# Patient Record
Sex: Female | Born: 1995 | Race: White | Hispanic: No | Marital: Single | Smoking: Never smoker
Health system: Southern US, Community
[De-identification: ages and names within clinical notes are randomized; demographics above are authoritative.]

## PROBLEM LIST (undated history)

## (undated) DIAGNOSIS — F909 Attention-deficit hyperactivity disorder, unspecified type: Secondary | ICD-10-CM

## (undated) DIAGNOSIS — Z973 Presence of spectacles and contact lenses: Secondary | ICD-10-CM

## (undated) DIAGNOSIS — F913 Oppositional defiant disorder: Secondary | ICD-10-CM

## (undated) DIAGNOSIS — F32A Depression, unspecified: Secondary | ICD-10-CM

## (undated) DIAGNOSIS — F329 Major depressive disorder, single episode, unspecified: Secondary | ICD-10-CM

## (undated) DIAGNOSIS — F419 Anxiety disorder, unspecified: Secondary | ICD-10-CM

## (undated) HISTORY — DX: Major depressive disorder, single episode, unspecified: F32.9

## (undated) HISTORY — DX: Oppositional defiant disorder: F91.3

## (undated) HISTORY — DX: Depression, unspecified: F32.A

## (undated) HISTORY — DX: Anxiety disorder, unspecified: F41.9

## (undated) HISTORY — PX: WISDOM TOOTH EXTRACTION: SHX21

## (undated) HISTORY — DX: Attention-deficit hyperactivity disorder, unspecified type: F90.9

## (undated) HISTORY — DX: Presence of spectacles and contact lenses: Z97.3

---
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
## 2014-01-31 ENCOUNTER — Encounter (HOSPITAL_COMMUNITY): Payer: Self-pay | Admitting: Psychiatry

## 2014-01-31 ENCOUNTER — Ambulatory Visit (INDEPENDENT_AMBULATORY_CARE_PROVIDER_SITE_OTHER): Payer: 59 | Admitting: Psychiatry

## 2014-01-31 ENCOUNTER — Other Ambulatory Visit (HOSPITAL_COMMUNITY): Payer: Self-pay | Admitting: *Deleted

## 2014-01-31 VITALS — BP 131/82 | HR 83 | Ht 68.0 in | Wt 125.8 lb

## 2014-01-31 DIAGNOSIS — F988 Other specified behavioral and emotional disorders with onset usually occurring in childhood and adolescence: Secondary | ICD-10-CM

## 2014-01-31 DIAGNOSIS — F509 Eating disorder, unspecified: Secondary | ICD-10-CM

## 2014-01-31 DIAGNOSIS — F330 Major depressive disorder, recurrent, mild: Secondary | ICD-10-CM

## 2014-01-31 DIAGNOSIS — F900 Attention-deficit hyperactivity disorder, predominantly inattentive type: Secondary | ICD-10-CM

## 2014-01-31 DIAGNOSIS — F411 Generalized anxiety disorder: Secondary | ICD-10-CM

## 2014-01-31 DIAGNOSIS — F329 Major depressive disorder, single episode, unspecified: Secondary | ICD-10-CM

## 2014-01-31 MED ORDER — BUSPIRONE HCL 5 MG PO TABS: 5.0000 mg | ORAL_TABLET | Freq: Two times a day (BID) | ORAL | Status: DC

## 2014-01-31 MED ORDER — METHYLPHENIDATE 20 MG/9HR TD PTCH: 1.0000 | MEDICATED_PATCH | Freq: Every day | TRANSDERMAL | Status: DC

## 2014-01-31 MED ORDER — BUPROPION HCL ER (XL) 300 MG PO TB24: 300.0000 mg | ORAL_TABLET | Freq: Every day | ORAL | Status: DC

## 2014-01-31 MED ORDER — METHYLPHENIDATE 20 MG/9HR TD PTCH
1.0000 | MEDICATED_PATCH | Freq: Every day | TRANSDERMAL | Status: DC
Start: 2014-01-31 — End: 2014-01-31

## 2014-01-31 NOTE — Telephone Encounter (Signed)
Reprinted prescription for [facility] due to printer error.

## 2014-01-31 NOTE — Progress Notes (Signed)
   [place] Follow-up Outpatient Visit  MALAYLA GRANBERRY 1996-11-09  Date:  01/31/14 Subjective: Patient is here for follow up Patient is always "tired", appetite is fine. Working as a Camera operator, and is applying to school. Concentration is good. Depression is 3/10, Anxiety 5/10, and denies side effects with medications. Eating isn't a problem; she exercises, pilates or yoga. Rtc in 4 weeks.    There were no vitals filed for this visit.  Mental Status Examination  Appearance: casual  Alert: Yes Attention: fair  Cooperative: Yes Eye Contact: Fair Speech: normal  Psychomotor Activity: Normal Memory/Concentration: fair  Oriented: time/date, situation, day of week and month of year Mood: Anxious and Dysphoric Affect: Constricted Thought Processes and Associations: Linear Fund of Knowledge: Fair Thought Content: preoccupations  Insight: Fair Judgement: Fair  Diagnosis:  MDD, recurrent, mild GAD ADD Eating disorder Treatment Plan:  Rtc in 4 weeks Bupropion XL 300 mg po QD, Buspar 5 mg, 2 times daily  Methylphenidate Daytrana 20 mg/9 hr  Madison Hickman, NP

## 2014-02-24 ENCOUNTER — Other Ambulatory Visit (HOSPITAL_COMMUNITY): Payer: Self-pay | Admitting: *Deleted

## 2014-02-24 NOTE — Telephone Encounter (Signed)
error 

## 2014-03-03 ENCOUNTER — Ambulatory Visit (HOSPITAL_COMMUNITY): Payer: Self-pay | Admitting: Psychiatry

## 2014-03-04 ENCOUNTER — Ambulatory Visit (HOSPITAL_COMMUNITY): Payer: Self-pay | Admitting: Psychiatry

## 2014-03-14 ENCOUNTER — Other Ambulatory Visit (HOSPITAL_COMMUNITY): Payer: Self-pay | Admitting: *Deleted

## 2014-03-14 ENCOUNTER — Telehealth (HOSPITAL_COMMUNITY): Payer: Self-pay | Admitting: *Deleted

## 2014-03-14 DIAGNOSIS — F988 Other specified behavioral and emotional disorders with onset usually occurring in childhood and adolescence: Secondary | ICD-10-CM

## 2014-03-14 MED ORDER — METHYLPHENIDATE 20 MG/9HR TD PTCH: 1.0000 | MEDICATED_PATCH | Freq: Every day | TRANSDERMAL | Status: DC

## 2014-03-14 NOTE — Telephone Encounter (Signed)
Opened twice in error

## 2014-04-08 ENCOUNTER — Encounter (HOSPITAL_COMMUNITY): Payer: Self-pay | Admitting: Psychiatry

## 2014-04-08 ENCOUNTER — Ambulatory Visit (INDEPENDENT_AMBULATORY_CARE_PROVIDER_SITE_OTHER): Payer: 59 | Admitting: Psychiatry

## 2014-04-08 VITALS — BP 105/78 | HR 74 | Ht 67.0 in | Wt 120.0 lb

## 2014-04-08 DIAGNOSIS — F988 Other specified behavioral and emotional disorders with onset usually occurring in childhood and adolescence: Secondary | ICD-10-CM

## 2014-04-08 DIAGNOSIS — F411 Generalized anxiety disorder: Secondary | ICD-10-CM

## 2014-04-08 DIAGNOSIS — F900 Attention-deficit hyperactivity disorder, predominantly inattentive type: Secondary | ICD-10-CM

## 2014-04-08 DIAGNOSIS — F330 Major depressive disorder, recurrent, mild: Secondary | ICD-10-CM

## 2014-04-08 DIAGNOSIS — F509 Eating disorder, unspecified: Secondary | ICD-10-CM

## 2014-04-08 DIAGNOSIS — F329 Major depressive disorder, single episode, unspecified: Secondary | ICD-10-CM

## 2014-04-08 MED ORDER — BUPROPION HCL ER (XL) 300 MG PO TB24: 300.0000 mg | ORAL_TABLET | Freq: Every day | ORAL | Status: DC

## 2014-04-08 MED ORDER — BUSPIRONE HCL 5 MG PO TABS: 5.0000 mg | ORAL_TABLET | Freq: Three times a day (TID) | ORAL | Status: DC

## 2014-04-08 MED ORDER — METHYLPHENIDATE 20 MG/9HR TD PTCH: 1.0000 | MEDICATED_PATCH | Freq: Every day | TRANSDERMAL | Status: DC

## 2014-04-08 NOTE — Progress Notes (Signed)
   [name] Follow-up Outpatient Visit  Susan Bauer [birth year]  Date:  04/08/14 Subjective: Pt is here for follow up Sleeping and eating normally. She was upset, her friend tried to hurt herself, and she was upset and not sleeping. The friend won't get help. Sleeping is better. Depression 3/10, Anxiety 8/10. Concentration is good. She is on bupropion XL 300 mg QD daily for depression and anxiety, Daytrana Patch 20 mg/9 hr for adhd, and buspar 5 mg, 2 times daily. She denies SI/HI/AVH   There were no vitals filed for this visit.  Mental Status Examination  Appearance: casual  Alert: Yes Attention: fair  Cooperative: Yes Eye Contact: Fair Speech: WDL Psychomotor Activity: Psychomotor Retardation Memory/Concentration:  Fair  Oriented: time/date, situation and day of week Mood: Anxious and Dysphoric Affect: Constricted Thought Processes and Associations: Goal Directed Fund of Knowledge: Fair Thought Content: preoccupations Insight: Fair Judgement: Fair  Diagnosis:  Eating disorder, unspecified ADD MDD, recurrent, mild GAD Treatment Plan:  Rtc in 4 weeks Buspar 5 mg, tid for anxiety Bupropion XL 300 mg daily for depression Daytrana Patch 20 mg/9  Madison Hickman, NP

## 2014-05-12 ENCOUNTER — Encounter (HOSPITAL_COMMUNITY): Payer: Self-pay | Admitting: Psychiatry

## 2014-05-12 ENCOUNTER — Ambulatory Visit (INDEPENDENT_AMBULATORY_CARE_PROVIDER_SITE_OTHER): Payer: 59 | Admitting: Psychiatry

## 2014-05-12 VITALS — BP 125/69 | HR 86 | Ht 68.5 in | Wt 127.8 lb

## 2014-05-12 DIAGNOSIS — F320 Major depressive disorder, single episode, mild: Secondary | ICD-10-CM

## 2014-05-12 DIAGNOSIS — F411 Generalized anxiety disorder: Secondary | ICD-10-CM

## 2014-05-12 DIAGNOSIS — F900 Attention-deficit hyperactivity disorder, predominantly inattentive type: Secondary | ICD-10-CM

## 2014-05-12 DIAGNOSIS — F988 Other specified behavioral and emotional disorders with onset usually occurring in childhood and adolescence: Secondary | ICD-10-CM

## 2014-05-12 DIAGNOSIS — F509 Eating disorder, unspecified: Secondary | ICD-10-CM

## 2014-05-12 MED ORDER — BUSPIRONE HCL 5 MG PO TABS: 5.0000 mg | ORAL_TABLET | Freq: Three times a day (TID) | ORAL | Status: DC

## 2014-05-12 MED ORDER — METHYLPHENIDATE 20 MG/9HR TD PTCH
1.0000 | MEDICATED_PATCH | Freq: Every day | TRANSDERMAL | Status: DC
Start: 2014-05-12 — End: 2014-05-12

## 2014-05-12 MED ORDER — METHYLPHENIDATE 20 MG/9HR TD PTCH
1.0000 | MEDICATED_PATCH | Freq: Every day | TRANSDERMAL | Status: DC
Start: 2014-05-12 — End: 2014-06-17

## 2014-05-12 MED ORDER — BUPROPION HCL ER (XL) 300 MG PO TB24: 300.0000 mg | ORAL_TABLET | Freq: Every day | ORAL | Status: DC

## 2014-05-12 NOTE — Progress Notes (Signed)
   [place] Follow-up Outpatient Visit  Susan Bauer Jul 24, 1996  Date:  05/12/14 Subjective: Pt is here for follow up Sleeping is poor; appetite is fair to poor. Mood is "stressed out." Depression 5/10, Anxiety 7/10. Pt is having back programs, and is not working at present time. Pt is trying to see therapist, next appointment is in September. Pt has been putting ice compresses on back; suggested warm compresses for pain,and swimming to exercise back. Mood is dysphoric,anxious. Tolerating her medications. Concentration is fair. She denies SI/HI/AVH. Rtc in 4 weeks.    There were no vitals filed for this visit.  Mental Status Examination  Appearance: casual  Alert: Yes Attention: fair  Cooperative: Yes Eye Contact: Fair Speech: slow  Psychomotor Activity: Psychomotor Retardation Memory/Concentration: fair  Oriented: time/date, situation and day of week Mood: Anxious and Dysphoric Affect: Constricted and Depressed Thought Processes and Associations: Linear Fund of Knowledge: Fair Thought Content: preoccupations Insight: Fair Judgement: Fair  Diagnosis:  ADD, without hyperactivity Eating disorder, unspecified MDD, single episode, mild  GAD Treatment Plan:  Rtc in 4 weeks Daytrana Patch 20 mg/dl for adhd Bupropion XL 300 mg po depression Buspar 5 mg tid for anxiety  Madison Hickman, NP

## 2014-05-16 ENCOUNTER — Ambulatory Visit (HOSPITAL_COMMUNITY): Payer: Self-pay | Admitting: Psychiatry

## 2014-05-27 ENCOUNTER — Ambulatory Visit (HOSPITAL_COMMUNITY): Payer: Self-pay | Admitting: Psychiatry

## 2014-06-17 ENCOUNTER — Encounter (HOSPITAL_COMMUNITY): Payer: Self-pay | Admitting: Psychiatry

## 2014-06-17 ENCOUNTER — Ambulatory Visit (INDEPENDENT_AMBULATORY_CARE_PROVIDER_SITE_OTHER): Payer: 59 | Admitting: Psychiatry

## 2014-06-17 VITALS — BP 107/73 | HR 88 | Ht 69.0 in | Wt 121.4 lb

## 2014-06-17 DIAGNOSIS — F331 Major depressive disorder, recurrent, moderate: Secondary | ICD-10-CM

## 2014-06-17 DIAGNOSIS — F900 Attention-deficit hyperactivity disorder, predominantly inattentive type: Secondary | ICD-10-CM

## 2014-06-17 DIAGNOSIS — F988 Other specified behavioral and emotional disorders with onset usually occurring in childhood and adolescence: Secondary | ICD-10-CM

## 2014-06-17 DIAGNOSIS — F411 Generalized anxiety disorder: Secondary | ICD-10-CM

## 2014-06-17 MED ORDER — ARIPIPRAZOLE 2 MG PO TABS: 2.0000 mg | ORAL_TABLET | Freq: Every day | ORAL | Status: DC

## 2014-06-17 MED ORDER — BUPROPION HCL ER (XL) 450 MG PO TB24: 450.0000 mg | ORAL_TABLET | Freq: Every day | ORAL | Status: DC

## 2014-06-17 MED ORDER — METHYLPHENIDATE 20 MG/9HR TD PTCH: 1.0000 | MEDICATED_PATCH | Freq: Every day | TRANSDERMAL | Status: DC

## 2014-06-17 NOTE — Progress Notes (Addendum)
   [name] Follow-up Outpatient Visit  Susan Bauer [birth year]  Date:  06/17/14 Subjective:  Depression 6/10, Anxiety 6/10. Sleeping 8 hours, during the day. Appetite is fine. She denies SI/HI/AVH. She recently went to a concert. Mom reports she has anhedonia, low energy, poor concentration. Mom reports she is isolative, withdrawn, poor ADL's. Pt has a PHQ9 is 13, which is moderate depression. Pt has low energy; suggested going to PCP to rule out medical issues, ie Anemia. Pt is taking ferrous sulfate 40 mg po, but it may not be enough. Pt sees therapist, Eloise Levels in September.Will increase bupropion XL 450 mg po daily for depression, aripiprazole 2 mg po for adjuvant therapy, buspar 5 mg tid for anxiety, Daytrana 20 mg Patch. Encouraged to keep a regular sleep-wake cycle. Rtc in 4 weeks.   There were no vitals filed for this visit.  Mental Status Examination  Appearance: casual  Alert: Yes Attention: fair  Cooperative: Yes Eye Contact: Fair Speech: slow  Psychomotor Activity: Psychomotor Retardation Memory/Concentration: fair  Oriented: time/date and day of week Mood: Anxious and Depressed Affect: Constricted and Depressed Thought Processes and Associations: Linear Fund of Knowledge: Fair Thought Content: preoccupations Insight: Fair Judgement: Fair  Diagnosis:  MDD, recurrent, moderate  GAD ADD  Treatment Plan:  Rtc in 4 weeks Bupropion XL 450 mg po for depression buspar 5 mg tid anxiety  Daytrana Patch 20 mg/9hours cor concentration  Madison Hickman, NP

## 2014-07-20 ENCOUNTER — Ambulatory Visit (HOSPITAL_COMMUNITY): Payer: Self-pay | Admitting: Psychiatry

## 2014-07-26 ENCOUNTER — Ambulatory Visit (HOSPITAL_COMMUNITY): Payer: Self-pay | Admitting: Psychiatry

## 2014-07-29 ENCOUNTER — Ambulatory Visit (INDEPENDENT_AMBULATORY_CARE_PROVIDER_SITE_OTHER): Payer: 59 | Admitting: Psychiatry

## 2014-07-29 DIAGNOSIS — F909 Attention-deficit hyperactivity disorder, unspecified type: Secondary | ICD-10-CM

## 2014-07-29 DIAGNOSIS — F900 Attention-deficit hyperactivity disorder, predominantly inattentive type: Secondary | ICD-10-CM

## 2014-07-29 DIAGNOSIS — F411 Generalized anxiety disorder: Secondary | ICD-10-CM

## 2014-07-29 NOTE — Progress Notes (Signed)
   THERAPIST PROGRESS NOTE  Session Time: 3:00-4:00   Participation Level: Active   Behavioral Response: CasualAlertAnxiousDysphoric   Type of Therapy: Individual Therapy   Treatment Goals addressed: managing anxiety, managing depression, body acceptance   Interventions: CBT, strength-based, supportive  Summary: Susan Bauer is a 18 y.o. female who presents with depression, anxiety.   Suicidal/Homicidal: Nowithout intent/plan   Therapist Response: Pt. Was joined in session by her mother Barbaraann Share). This is patient's first session since July 27 2013. Pt. 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 Pt. Was preoccupied with thoughts that her co-workers did not like her. Pt. Was forced to discontinue the job because of back pain that was aggravated by lifting the animals and sweeping and mopping. Pt. Since learned that she has mild scoliosis. Pt. Is now considering a job working in a retirement facility. Pt. Continues to be focused on co-dependent relationship with her best friend and belief that she is responsible for the quality of her friends life who consistently makes poor choices. Pt. Is focused on body pain and mysterious somatic complaints and favorite shows are House and Criminal minds. Pt. Was encouraged to explore [email or web address]://www.[email or web address].[email or web address] in order to find social groups with common interests. Pt. Would like to work on social anxiety, fears about driving, difficulty developing motivation for life tasks such as applying for jobs and continuing her education. Brief education about DBT groups was given.   Plan: Return again in 3 weeks.   Diagnosis: Axis I: Anxiety Disorder NOS and Depressive Disorder NOS   Axis II: No diagnosis     Renford Dills 07/29/2014

## 2014-08-19 ENCOUNTER — Ambulatory Visit (HOSPITAL_COMMUNITY): Payer: Self-pay | Admitting: Psychiatry

## 2014-09-09 ENCOUNTER — Ambulatory Visit (HOSPITAL_COMMUNITY): Payer: Self-pay | Admitting: Psychiatry

## 2014-09-22 ENCOUNTER — Encounter (HOSPITAL_COMMUNITY): Payer: Self-pay | Admitting: Psychiatry

## 2014-09-22 ENCOUNTER — Ambulatory Visit (INDEPENDENT_AMBULATORY_CARE_PROVIDER_SITE_OTHER): Payer: 59 | Admitting: Psychiatry

## 2014-09-22 VITALS — BP 130/59 | HR 65 | Ht 68.25 in | Wt 130.8 lb

## 2014-09-22 DIAGNOSIS — F4010 Social phobia, unspecified: Secondary | ICD-10-CM | POA: Insufficient documentation

## 2014-09-22 DIAGNOSIS — F418 Other specified anxiety disorders: Secondary | ICD-10-CM

## 2014-09-22 DIAGNOSIS — F411 Generalized anxiety disorder: Secondary | ICD-10-CM

## 2014-09-22 DIAGNOSIS — F988 Other specified behavioral and emotional disorders with onset usually occurring in childhood and adolescence: Secondary | ICD-10-CM

## 2014-09-22 DIAGNOSIS — F900 Attention-deficit hyperactivity disorder, predominantly inattentive type: Secondary | ICD-10-CM

## 2014-09-22 DIAGNOSIS — F332 Major depressive disorder, recurrent severe without psychotic features: Secondary | ICD-10-CM

## 2014-09-22 MED ORDER — FLUOXETINE HCL 40 MG PO CAPS: 40.0000 mg | ORAL_CAPSULE | Freq: Every day | ORAL | Status: DC

## 2014-09-22 MED ORDER — ARIPIPRAZOLE 2 MG PO TABS: 2.0000 mg | ORAL_TABLET | Freq: Every day | ORAL | Status: DC

## 2014-09-22 MED ORDER — METHYLPHENIDATE 20 MG/9HR TD PTCH: 1.0000 | MEDICATED_PATCH | Freq: Every day | TRANSDERMAL | Status: DC

## 2014-09-22 NOTE — Progress Notes (Signed)
[facility] [phone] Progress Note  Susan Bauer [id number] 18 y.o.  09/22/2014 3:49 PM  Chief Complaint: f/up  History of Present Illness: Here with mother.  Pt is now working with a new therapist Dr. Ernestene Mention.   Pt thinks she has Bipolar disorder instead of depression and anxiety. Pt states she has periods of intense anger and depression. She gets angry ever couple of weeks that last for 3-7 days. During that time her sleep is more restless. Energy is minorly increased. Motivation is a little better and she is able to finish one or 2 things. Her stress tolerance is low and she denies mood lability. As she has grown older pt is a little better able to control her behavior. Denies grandiose thoughts. During this time she will spend a little more (up to $30 per day, not more than in her bank account). Symptoms will suddenly go away.  Other time she is upset, tired and depressed. She is depressed on a daily basis and level is 7/10. Pt reports isolation and will not leave her room for days on end. Reports feeling withdrawn, low motivation, anhedonia, crying spells and worthlessness. Sleep is poor in general. She has poor body image and she is snacking all day long.   Anxiety is high and she has poor self esteem. Feels like no one likes her and she is boring. Last panic attack was 3 months ago.  Concentration is fair with Daytrona patch. She reports a decreased appetite and some mild skin irritation.   Pt is not taking Buspar b/c it causes dizziness.  Pt is taking Abilify and Wellbutrin as prescribed and denies SE.  Suicidal Ideation: No Plan Formed: No Patient has means to carry out plan: No  Homicidal Ideation: No Plan Formed: No Patient has means to carry out plan: No  Review of Systems: Psychiatric: Agitation: Yes Hallucination: No Depressed Mood: Yes Insomnia: Yes Hypersomnia: No Altered Concentration: No Feels Worthless: Yes Grandiose Ideas: No Belief In  Special Powers: No New/Increased Substance Abuse: No Compulsions: No  Neurologic: Headache: Yes Seizure: No Paresthesias: No  Review of Systems  Constitutional: Negative for fever and chills.  HENT: Negative for congestion, ear discharge, nosebleeds and sore throat.   Eyes: Negative for blurred vision, double vision and redness.  Respiratory: Negative for cough, sputum production and shortness of breath.   Cardiovascular: Negative for chest pain, palpitations and leg swelling.  Gastrointestinal: Positive for abdominal pain. Negative for heartburn, nausea and vomiting.  Musculoskeletal: Negative for back pain, joint pain and neck pain.  Skin: Negative for itching and rash.  Neurological: Positive for dizziness and headaches. Negative for tremors, seizures and weakness.  Psychiatric/Behavioral: Positive for depression. Negative for suicidal ideas, hallucinations and substance abuse. The patient is nervous/anxious and has insomnia.      Past Medical Family, Social History: lives with mom.  reports that she has never smoked. She has never used smokeless tobacco. She reports that she does not drink alcohol or use illicit drugs.  Family History  Problem Relation Age of Onset  . Depression Mother    Past Medical History  Diagnosis Date  . Wears glasses   . ADHD (attention deficit hyperactivity disorder)   . Oppositional defiant disorder   . Anxiety   . Depression     Outpatient Encounter Prescriptions as of 09/22/2014  Medication Sig  . ARIPiprazole (ABILIFY) 2 MG tablet Take 1 tablet (2 mg total) by mouth daily.  Marland Kitchen buPROPion 450 MG TB24 Take  450 mg by mouth daily.  . Calcium Carbonate-Vitamin D (CALCIUM 600+D HIGH POTENCY) 600-400 MG-UNIT per tablet Take 1 tablet by mouth 2 (two) times daily.  . Clindamycin Phos-Benzoyl Perox (ONEXTON) 1.2-3.75 % GEL Apply topically.  . ferrous sulfate 325 (65 FE) MG tablet Take 325 mg by mouth daily with breakfast.  . methylphenidate  (DAYTRANA) 20 MG/9HR Place 1 patch onto the skin daily. wear patch for 9 hours only each day  . MINOCYCLINE HCL PO Take by mouth.  . Multiple Vitamin (MULITIVITAMIN WITH MINERALS) TABS Take 1 tablet by mouth daily.  . norethindrone-ethinyl estradiol (JUNEL FE,GILDESS FE,LOESTRIN FE) 1-20 MG-MCG tablet Take 1 tablet by mouth daily.  . busPIRone (BUSPAR) 5 MG tablet Take 1 tablet (5 mg total) by mouth 3 (three) times daily.    Past Psychiatric History/Hospitalization(s): Anxiety: Yes Bipolar Disorder: No Depression: Yes Mania: No Psychosis: No Schizophrenia: No Personality Disorder: No Hospitalization for psychiatric illness: No History of Electroconvulsive Shock Therapy: No Prior Suicide Attempts: Yes  Physical Exam: Constitutional:  BP 130/59 mmHg  Pulse 65  Ht 5' 8.25" (1.734 m)  Wt 130 lb 12.8 oz (59.33 kg)  BMI 19.73 kg/m2  General Appearance: alert, oriented, no acute distress and well nourished  Musculoskeletal: Strength & Muscle Tone: within normal limits Gait & Station: normal Patient leans: N/A  Mental Status Examination/Evaluation: Objective: Attitude: Calm and cooperative  Appearance: Fairly Groomed, appears to be stated age  Eye Contact::  Minimal  Speech:  Clear and Coherent and Normal Rate  Volume:  Normal  Mood:  depressed  Affect:  Congruent  Thought Process:  Goal Directed, Linear and Logical  Orientation:  Full (Time, Place, and Person)  Thought Content:  Negative  Suicidal Thoughts:  No  Homicidal Thoughts:  No  Judgement:  Fair  Insight:  Fair  Concentration: good  Memory: Immediate-intact Recent-intact Remote-intact  Recall: fair  Language: fair  Gait and Station: normal  [employer] of Knowledge: average  Psychomotor Activity:  Normal  Akathisia:  No  Handed:  Right  AIMS (if indicated):  Facial and Oral Movements  Muscles of Facial Expression: None, normal  Lips and Perioral Area: None, normal  Jaw: None, normal  Tongue: None,  normal Extremity Movements: Upper (arms, wrists, hands, fingers): None, normal  Lower (legs, knees, ankles, toes): None, normal,  Trunk Movements:  Neck, shoulders, hips: None, normal,  Overall Severity : Severity of abnormal movements (highest score from questions above): None, normal  Incapacitation due to abnormal movements: None, normal  Patient's awareness of abnormal movements (rate only patient's report): No Awareness, Dental Status  Current problems with teeth and/or dentures?: No  Does patient usually wear dentures?: No    Assets:  Communication Skills Desire for Improvement Financial Resources/Insurance Housing Leisure Time Physical Health Resilience Social Support Talents/Skills       Medical Decision Making (Choose Three): Review of Psycho-Social Stressors (1), Review or order clinical lab tests (1), Review and summation of old records (2), Established Problem, Worsening (2), Review of Medication Regimen & Side Effects (2) and Review of New Medication or Change in Dosage (2)  Assessment: Axis I: MDD- recurrent, severe without psychotic features, GAD, Social anxiety disorder, ADHD- inattentive type  Axis II: deferred  Axis III:  Past Medical History  Diagnosis Date  . Wears glasses   . ADHD (attention deficit hyperactivity disorder)   . Oppositional defiant disorder   . Anxiety   . Depression      Axis IV: home stressors, limited  coping skills  Axis V: GAF 51-60   Plan:   Abilify 2mg  po qD for adjunct treatment of mood D/c Bupropion as pt reports it is ineffective and she was only taking 150mg  daily Restart Prozac 40mg  po for depression (Reviewed pt's records and it appears she benefited from Prozac and was on  40mg  up until Oct 2014) D/c buspar as pt reports dizziness as a SE  Daytrana Patch 20 mg/9hours for concentration  Medication management with supportive therapy. Risks/benefits and SE of the medication discussed. Pt verbalized understanding  and verbal consent obtained for treatment.  Affirm with the patient that the medications are taken as ordered. Patient expressed understanding of how their medications were to be used.  -worsening of symptoms  Labs- pt will sign MR to get recent labs from PCP  Therapy: brief supportive therapy provided. Discussed psychosocial stressors in detail.   Encouraged to continue individual therapy with Dr. Ernestene Mention  Pt denies SI and is at an acute low risk for suicide.Patient told to call clinic if any problems occur. Patient advised to go to ER if they should develop SI/HI, side effects, or if symptoms worsen. Has crisis numbers to call if needed. Pt verbalized understanding.  F/up in 2 months or sooner if needed   Charlcie Cradle, MD 09/22/2014

## 2014-09-30 ENCOUNTER — Ambulatory Visit (HOSPITAL_COMMUNITY): Payer: Self-pay | Admitting: Psychiatry

## 2014-11-22 ENCOUNTER — Telehealth (HOSPITAL_COMMUNITY): Payer: Self-pay | Admitting: Psychiatry

## 2014-11-22 ENCOUNTER — Encounter (HOSPITAL_COMMUNITY): Payer: Self-pay | Admitting: Psychiatry

## 2014-11-22 ENCOUNTER — Ambulatory Visit (INDEPENDENT_AMBULATORY_CARE_PROVIDER_SITE_OTHER): Payer: 59 | Admitting: Psychiatry

## 2014-11-22 VITALS — BP 123/64 | HR 76 | Ht 68.5 in | Wt 128.6 lb

## 2014-11-22 DIAGNOSIS — F988 Other specified behavioral and emotional disorders with onset usually occurring in childhood and adolescence: Secondary | ICD-10-CM

## 2014-11-22 DIAGNOSIS — F418 Other specified anxiety disorders: Secondary | ICD-10-CM

## 2014-11-22 DIAGNOSIS — F332 Major depressive disorder, recurrent severe without psychotic features: Secondary | ICD-10-CM

## 2014-11-22 DIAGNOSIS — F900 Attention-deficit hyperactivity disorder, predominantly inattentive type: Secondary | ICD-10-CM

## 2014-11-22 DIAGNOSIS — F411 Generalized anxiety disorder: Secondary | ICD-10-CM

## 2014-11-22 DIAGNOSIS — F4010 Social phobia, unspecified: Secondary | ICD-10-CM

## 2014-11-22 MED ORDER — ARIPIPRAZOLE 2 MG PO TABS: 2.0000 mg | ORAL_TABLET | Freq: Every day | ORAL | Status: DC

## 2014-11-22 MED ORDER — FLUOXETINE HCL 20 MG PO CAPS: 60.0000 mg | ORAL_CAPSULE | Freq: Every day | ORAL | Status: DC

## 2014-11-22 MED ORDER — LORAZEPAM 0.5 MG PO TABS: 0.5000 mg | ORAL_TABLET | Freq: Every day | ORAL | Status: AC | PRN

## 2014-11-22 MED ORDER — METHYLPHENIDATE 20 MG/9HR TD PTCH: 1.0000 | MEDICATED_PATCH | Freq: Every day | TRANSDERMAL | Status: DC

## 2014-11-22 NOTE — Telephone Encounter (Signed)
Received a message from pt's mother asking that provider contact pt's psychologist Dr. Ernestene Mention at [phone].  I have called and left a message for Dr. Rodman Pickle asking for a call back at her earliest convenience.

## 2014-11-22 NOTE — Progress Notes (Signed)
[facility] [phone] Progress Note  Susan Bauer [id number] 19 y.o.  11/22/2014 4:15 PM  Chief Complaint: better  History of Present Illness: Here with mother. Mom states Prozac has made a big difference. Seems happier and is more social. She still has a lot of anxiety.  Pt is working with a new therapist Dr. Ernestene Mention.   Pt states anxiety is mostly social. She can't talk to strangers well. It is holding her back from college. Pt is avoiding college b/c of it. Pt would like to move forward and accomplish some things. Prozac has made no difference with social anxiety. Pt gets very anxious before work and sometimes can't sleep. 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 Denies any recent panic attacks. Worried about being judged and doing something wrong.   Depression has improved significantly. Energy is good. Not comparing herself to friends and is more social with friends. She is trying to make new friends online and friends are introducing her to new people. Denies anhedonia and crying spells and sad mood. No longer feels lifeless and numb. Appetite is ok and she eating 3 meals/day. Most nights sleeping about 6-7 hrs.  Body image is a little better but still bad. She is working on that in therapy.   Concentration is fair with Daytrona patch. She reports a decreased appetite and some mild skin irritation.   Pt is taking Abilify as prescribed and denies SE.  Suicidal Ideation: No Plan Formed: No Patient has means to carry out plan: No  Homicidal Ideation: No Plan Formed: No Patient has means to carry out plan: No  Review of Systems: Psychiatric: Agitation: No Hallucination: No Depressed Mood: No Insomnia: No Hypersomnia: No Altered Concentration: No Feels Worthless: Yes Grandiose Ideas: No Belief In Special Powers: No New/Increased Substance Abuse: No Compulsions: No  Neurologic: Headache: No Seizure: No Paresthesias:  No  Review of Systems  Constitutional: Negative for fever and chills.  HENT: Negative for congestion, ear discharge, nosebleeds and sore throat.   Eyes: Negative for blurred vision, double vision and redness.  Respiratory: Negative for cough, sputum production and shortness of breath.   Cardiovascular: Negative for chest pain, palpitations and leg swelling.  Gastrointestinal: Negative for heartburn, nausea, vomiting and abdominal pain.  Musculoskeletal: Negative for back pain, joint pain and neck pain.  Skin: Negative for itching and rash.  Neurological: Negative for dizziness, tremors, seizures, weakness and headaches.  Psychiatric/Behavioral: Negative for depression, suicidal ideas, hallucinations and substance abuse. The patient is nervous/anxious. The patient does not have insomnia.      Past Medical Family, Social History: lives with mom. Works at a [employer], [employer].   reports that she has never smoked. She has never used smokeless tobacco. She reports that she does not drink alcohol or use illicit drugs.  Family History  Problem Relation Age of Onset  . Depression Mother    Past Medical History  Diagnosis Date  . Wears glasses   . ADHD (attention deficit hyperactivity disorder)   . Oppositional defiant disorder   . Anxiety   . Depression     Outpatient Encounter Prescriptions as of 11/22/2014  Medication Sig  . ARIPiprazole (ABILIFY) 2 MG tablet Take 1 tablet (2 mg total) by mouth daily.  . Calcium Carbonate-Vitamin D (CALCIUM 600+D HIGH POTENCY) 600-400 MG-UNIT per tablet Take 1 tablet by mouth 2 (two) times daily.  . Clindamycin Phos-Benzoyl Perox (ONEXTON) 1.2-3.75 % GEL Apply topically.  . ferrous sulfate 325 (65  FE) MG tablet Take 325 mg by mouth daily with breakfast.  . FLUoxetine (PROZAC) 40 MG capsule Take 1 capsule (40 mg total) by mouth daily.  . methylphenidate (DAYTRANA) 20 MG/9HR Place 1 patch onto the skin daily. wear patch for 9 hours only each day  .  methylphenidate (DAYTRANA) 20 MG/9HR Place 1 patch onto the skin daily. wear patch for 9 hours only each day  . MINOCYCLINE HCL PO Take by mouth.  . Multiple Vitamin (MULITIVITAMIN WITH MINERALS) TABS Take 1 tablet by mouth daily.  . norethindrone-ethinyl estradiol (JUNEL FE,GILDESS FE,LOESTRIN FE) 1-20 MG-MCG tablet Take 1 tablet by mouth daily.    Past Psychiatric History/Hospitalization(s): Anxiety: Yes Bipolar Disorder: No Depression: Yes Mania: No Psychosis: No Schizophrenia: No Personality Disorder: No Hospitalization for psychiatric illness: No History of Electroconvulsive Shock Therapy: No Prior Suicide Attempts: Yes  Physical Exam: Constitutional:  BP 123/64 mmHg  Pulse 76  Ht 5' 8.5" (1.74 m)  Wt 128 lb 9.6 oz (58.333 kg)  BMI 19.27 kg/m2  General Appearance: alert, oriented, no acute distress and well nourished  Musculoskeletal: Strength & Muscle Tone: within normal limits Gait & Station: normal Patient leans: N/A  Mental Status Examination/Evaluation: Objective: Attitude: Calm and cooperative  Appearance: Fairly Groomed, appears to be stated age  Eye Contact::  Minimal  Speech:  Clear and Coherent and Normal Rate  Volume:  Normal  Mood:  euthymic  Affect:  Congruent  Thought Process:  Goal Directed, Linear and Logical  Orientation:  Full (Time, Place, and Person)  Thought Content:  Negative  Suicidal Thoughts:  No  Homicidal Thoughts:  No  Judgement:  Fair  Insight:  Fair  Concentration: good  Memory: Immediate-intact Recent-intact Remote-intact  Recall: fair  Language: fair  Gait and Station: normal  [employer] of Knowledge: average  Psychomotor Activity:  Normal  Akathisia:  No  Handed:  Right  AIMS (if indicated):  Facial and Oral Movements  Muscles of Facial Expression: None, normal  Lips and Perioral Area: None, normal  Jaw: None, normal  Tongue: None, normal Extremity Movements: Upper (arms, wrists, hands, fingers): None, normal   Lower (legs, knees, ankles, toes): None, normal,  Trunk Movements:  Neck, shoulders, hips: None, normal,  Overall Severity : Severity of abnormal movements (highest score from questions above): None, normal  Incapacitation due to abnormal movements: None, normal  Patient's awareness of abnormal movements (rate only patient's report): No Awareness, Dental Status  Current problems with teeth and/or dentures?: No  Does patient usually wear dentures?: No    Assets:  Communication Skills Desire for Improvement Financial Resources/Insurance Housing Leisure Time Physical Health Resilience Social Support Talents/Skills       Medical Decision Making (Choose Three): Established Problem, Stable/Improving (1), Review of Psycho-Social Stressors (1), Review of Medication Regimen & Side Effects (2) and Review of New Medication or Change in Dosage (2)  Assessment: Axis I: MDD- recurrent, severe without psychotic features, GAD, Social anxiety disorder, ADHD- inattentive type  Axis II: deferred  Axis III:  Past Medical History  Diagnosis Date  . Wears glasses   . ADHD (attention deficit hyperactivity disorder)   . Oppositional defiant disorder   . Anxiety   . Depression      Axis IV: home stressors, limited coping skills  Axis V: GAF 51-60   Plan:   Abilify 2mg  po qD for adjunct treatment of mood Increase Prozac 60mg  po for depression  Daytrana Patch 20 mg/9hours for concentration Start trial of Ativan 0.5mg  po  qD prn anxiety  Medication management with supportive therapy. Risks/benefits and SE of the medication discussed. Pt verbalized understanding and verbal consent obtained for treatment.  Affirm with the patient that the medications are taken as ordered. Patient expressed understanding of how their medications were to be used.  -improvement of symptoms  Labs- pt will sign MR to get recent labs from PCP  Therapy: brief supportive therapy provided. Discussed psychosocial  stressors in detail.   Encouraged to continue individual therapy with Dr. Ernestene Mention  Pt denies SI and is at an acute low risk for suicide.Patient told to call clinic if any problems occur. Patient advised to go to ER if they should develop SI/HI, side effects, or if symptoms worsen. Has crisis numbers to call if needed. Pt verbalized understanding.  F/up in 2 months or sooner if needed  Charlcie Cradle, MD 11/22/2014

## 2014-11-25 ENCOUNTER — Ambulatory Visit (HOSPITAL_COMMUNITY): Payer: Self-pay | Admitting: Psychiatry

## 2014-12-16 ENCOUNTER — Ambulatory Visit (HOSPITAL_COMMUNITY): Payer: Self-pay | Admitting: Psychiatry

## 2015-01-06 ENCOUNTER — Ambulatory Visit (HOSPITAL_COMMUNITY): Payer: Self-pay | Admitting: Psychiatry

## 2015-01-19 ENCOUNTER — Ambulatory Visit (INDEPENDENT_AMBULATORY_CARE_PROVIDER_SITE_OTHER): Payer: 59 | Admitting: Psychiatry

## 2015-01-19 ENCOUNTER — Encounter (HOSPITAL_COMMUNITY): Payer: Self-pay | Admitting: Psychiatry

## 2015-01-19 VITALS — BP 116/71 | HR 75 | Ht 68.5 in | Wt 137.0 lb

## 2015-01-19 DIAGNOSIS — Z79899 Other long term (current) drug therapy: Secondary | ICD-10-CM

## 2015-01-19 DIAGNOSIS — F418 Other specified anxiety disorders: Secondary | ICD-10-CM

## 2015-01-19 DIAGNOSIS — F332 Major depressive disorder, recurrent severe without psychotic features: Secondary | ICD-10-CM

## 2015-01-19 DIAGNOSIS — F900 Attention-deficit hyperactivity disorder, predominantly inattentive type: Secondary | ICD-10-CM

## 2015-01-19 DIAGNOSIS — F4010 Social phobia, unspecified: Secondary | ICD-10-CM

## 2015-01-19 DIAGNOSIS — F411 Generalized anxiety disorder: Secondary | ICD-10-CM

## 2015-01-19 MED ORDER — ARIPIPRAZOLE 2 MG PO TABS: 2.0000 mg | ORAL_TABLET | Freq: Every day | ORAL | Status: DC

## 2015-01-19 MED ORDER — FLUOXETINE HCL 20 MG PO CAPS: 60.0000 mg | ORAL_CAPSULE | Freq: Every day | ORAL | Status: DC

## 2015-01-19 MED ORDER — METHYLPHENIDATE HCL ER (LA) 20 MG PO CP24: 20.0000 mg | ORAL_CAPSULE | Freq: Every day | ORAL | Status: DC

## 2015-01-19 NOTE — Patient Instructions (Signed)
EKG  Call [phone]

## 2015-01-19 NOTE — Progress Notes (Signed)
Patient ID: Susan Bauer, female   DOB: 09-14-1996, 19 y.o.   MRN: [id number]  [facility] [id number] Progress Note  Susan Bauer [id number] 19 y.o.  01/19/2015 4:27 PM  Chief Complaint: doing good  History of Present Illness: Here with mother. Mom states Prozac has made a big difference. Seems happier and is more social. No longer complaining about physical ailments.   Pt is back at work and socializing with new people. Anxiety is a little better. Pt states anxiety is mostly social.  Prozac at higher dose has made some difference with social anxiety. Pt no longer gets anxious before work and is able to sleep. 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 Denies any recent panic attacks.  Depression has improved significantly and is mostly situational. Energy is good and she no longer feels tired all the time. She is more social with friends. She is trying to make new friends online and friends are introducing her to new people. Denies anhedonia, isolation and crying spells and sad mood. She is drawing again. Appetite is ok and she eating 3 meals/day. Most nights sleeping about 6-7 hrs and feels rested when she wake up.  Body image is a little bad and she is worried she can gained weight. Pt is not restricting or purging. She is working on that in therapy.   Concentration is fair with Daytrona patch. It lasts about 9 hrs. She reports a decreased appetite and some mild skin irritation because she forgets to take off the patch. Pt would like to switch to the oral form.   Pt is taking Abilify as prescribed and denies SE. Feels increased dose of Prozac is helping. Pt has only taken Ativan 3 times since it was prescribed.  Suicidal Ideation: No Plan Formed: No Patient has means to carry out plan: No  Homicidal Ideation: No Plan Formed: No Patient has means to carry out plan: No  Review of Systems: Psychiatric: Agitation: No Hallucination: No Depressed  Mood: No Insomnia: No Hypersomnia: No Altered Concentration: No Feels Worthless: No Grandiose Ideas: No Belief In Special Powers: No New/Increased Substance Abuse: No Compulsions: No  Neurologic: Headache: No Seizure: No Paresthesias: No  Review of Systems  Constitutional: Negative for fever, chills and weight loss.  HENT: Negative for congestion, ear pain and sore throat.   Eyes: Negative for blurred vision, double vision and pain.  Respiratory: Negative for cough, shortness of breath and wheezing.   Cardiovascular: Negative for chest pain, palpitations and leg swelling.  Gastrointestinal: Negative for heartburn, nausea, vomiting and abdominal pain.  Musculoskeletal: Positive for back pain. Negative for joint pain and neck pain.  Skin: Negative for itching and rash.  Neurological: Negative for dizziness, sensory change, seizures, loss of consciousness, weakness and headaches.  Psychiatric/Behavioral: Negative for depression, suicidal ideas, hallucinations and substance abuse. The patient is nervous/anxious. The patient does not have insomnia.      Past Medical Family, Social History: lives with mom. Works at a [employer], [employer].   reports that she has never smoked. She has never used smokeless tobacco. She reports that she does not drink alcohol or use illicit drugs.  Family History  Problem Relation Age of Onset  . Depression Mother    Past Medical History  Diagnosis Date  . Wears glasses   . ADHD (attention deficit hyperactivity disorder)   . Oppositional defiant disorder   . Anxiety   . Depression     Outpatient Encounter Prescriptions as of  01/19/2015  Medication Sig  . ARIPiprazole (ABILIFY) 2 MG tablet Take 1 tablet (2 mg total) by mouth daily.  . Calcium Carbonate-Vitamin D (CALCIUM 600+D HIGH POTENCY) 600-400 MG-UNIT per tablet Take 1 tablet by mouth 2 (two) times daily.  . Clindamycin Phos-Benzoyl Perox (ONEXTON) 1.2-3.75 % GEL Apply topically.  . ferrous sulfate  325 (65 FE) MG tablet Take 325 mg by mouth daily with breakfast.  . FLUoxetine (PROZAC) 20 MG capsule Take 3 capsules (60 mg total) by mouth daily.  Marland Kitchen LORazepam (ATIVAN) 0.5 MG tablet Take 1 tablet (0.5 mg total) by mouth daily as needed for anxiety or sleep.  . methylphenidate (DAYTRANA) 20 MG/9HR Place 1 patch onto the skin daily. wear patch for 9 hours only each day  . methylphenidate (DAYTRANA) 20 MG/9HR Place 1 patch onto the skin daily. wear patch for 9 hours only each day  . MINOCYCLINE HCL PO Take by mouth.  . Multiple Vitamin (MULITIVITAMIN WITH MINERALS) TABS Take 1 tablet by mouth daily.  . norethindrone-ethinyl estradiol (JUNEL FE,GILDESS FE,LOESTRIN FE) 1-20 MG-MCG tablet Take 1 tablet by mouth daily.    Past Psychiatric History/Hospitalization(s): Anxiety: Yes Bipolar Disorder: No Depression: Yes Mania: No Psychosis: No Schizophrenia: No Personality Disorder: No Hospitalization for psychiatric illness: No History of Electroconvulsive Shock Therapy: No Prior Suicide Attempts: Yes  Physical Exam: Constitutional:  BP 116/71 mmHg  Pulse 75  Ht 5' 8.5" (1.74 m)  Wt 137 lb (62.143 kg)  BMI 20.53 kg/m2  General Appearance: alert, oriented, no acute distress and well nourished  Musculoskeletal: Strength & Muscle Tone: within normal limits Gait & Station: normal Patient leans: N/A  Mental Status Examination/Evaluation: Objective: Attitude: Calm and cooperative  Appearance: Fairly Groomed, appears to be stated age  Eye Contact::  Minimal  Speech:  Clear and Coherent and Normal Rate  Volume:  Normal  Mood:  euthymic  Affect:  Congruent  Thought Process:  Goal Directed, Linear and Logical  Orientation:  Full (Time, Place, and Person)  Thought Content:  Negative  Suicidal Thoughts:  No  Homicidal Thoughts:  No  Judgement:  Fair  Insight:  Fair  Concentration: good  Memory: Immediate-intact Recent-intact Remote-intact  Recall: fair  Language: fair  Gait and  Station: normal  [employer] of Knowledge: average  Psychomotor Activity:  Normal  Akathisia:  No  Handed:  Right  AIMS (if indicated):  Facial and Oral Movements  Muscles of Facial Expression: None, normal  Lips and Perioral Area: None, normal  Jaw: None, normal  Tongue: None, normal Extremity Movements: Upper (arms, wrists, hands, fingers): None, normal  Lower (legs, knees, ankles, toes): None, normal,  Trunk Movements:  Neck, shoulders, hips: None, normal,  Overall Severity : Severity of abnormal movements (highest score from questions above): None, normal  Incapacitation due to abnormal movements: None, normal  Patient's awareness of abnormal movements (rate only patient's report): No Awareness, Dental Status  Current problems with teeth and/or dentures?: No  Does patient usually wear dentures?: No    Assets:  Communication Skills Desire for Improvement Financial Resources/Insurance Housing Leisure Time Physical Health Resilience Social Support Talents/Skills       Medical Decision Making (Choose Three): Established Problem, Stable/Improving (1), Review of Psycho-Social Stressors (1), Review or order clinical lab tests (1) and Review of Medication Regimen & Side Effects (2)  Assessment: Axis I: MDD- recurrent, severe without psychotic features, GAD, Social anxiety disorder, ADHD- inattentive type  Axis II: deferred  Axis III:  Past Medical History  Diagnosis  Date  . Wears glasses   . ADHD (attention deficit hyperactivity disorder)   . Oppositional defiant disorder   . Anxiety   . Depression      Axis IV: home stressors, limited coping skills  Axis V: GAF 51-60   Plan:   Abilify 2mg  po qD for adjunct treatment of mood Prozac 60mg  po for depression  D/c Daytrana Patch due to SE. Mother gave back last 2 scripts of Daytrana and they were placed in the shred box Start trial of Ritalin LA mg po qD for concentration Ativan 0.5mg  po qD prn anxiety   Medication management with supportive therapy. Risks/benefits and SE of the medication discussed. Pt verbalized understanding and verbal consent obtained for treatment.  Affirm with the patient that the medications are taken as ordered. Patient expressed understanding of how their medications were to be used.  -improvement of symptoms  Labs- reviewed labs from PCP 07/08/2014  CBC WNL, CMP WNL, TSH WNL, Prolactic WNL, order EKG  Therapy: brief supportive therapy provided. Discussed psychosocial stressors in detail.   Encouraged to continue individual therapy with Dr. Ernestene Mention  Pt denies SI and is at an acute low risk for suicide.Patient told to call clinic if any problems occur. Patient advised to go to ER if they should develop SI/HI, side effects, or if symptoms worsen. Has crisis numbers to call if needed. Pt verbalized understanding.  F/up in 3 months or sooner if needed  Charlcie Cradle, MD 01/19/2015

## 2015-01-27 ENCOUNTER — Ambulatory Visit (HOSPITAL_COMMUNITY): Payer: Self-pay | Admitting: Psychiatry

## 2015-01-31 ENCOUNTER — Ambulatory Visit (HOSPITAL_COMMUNITY)
Admission: RE | Admit: 2015-01-31 | Discharge: 2015-01-31 | Disposition: A | Discharge status: Home / Self Care | Payer: 59 | Source: Ambulatory Visit | Attending: Family Medicine | Admitting: Family Medicine

## 2015-01-31 ENCOUNTER — Telehealth (HOSPITAL_COMMUNITY): Payer: Self-pay

## 2015-01-31 ENCOUNTER — Other Ambulatory Visit (HOSPITAL_COMMUNITY): Payer: Self-pay | Admitting: Family Medicine

## 2015-01-31 ENCOUNTER — Encounter (HOSPITAL_COMMUNITY): Payer: Self-pay

## 2015-01-31 DIAGNOSIS — R55 Syncope and collapse: Secondary | ICD-10-CM | POA: Diagnosis not present

## 2015-02-17 ENCOUNTER — Ambulatory Visit (HOSPITAL_COMMUNITY): Payer: Self-pay | Admitting: Psychiatry

## 2015-02-28 ENCOUNTER — Telehealth (HOSPITAL_COMMUNITY): Payer: Self-pay | Admitting: *Deleted

## 2015-02-28 NOTE — Telephone Encounter (Signed)
Dr. Doyne Keel,   Mom request call back.  Mom left message on nurse's voice mail stating patient had reaction when taking Ritalin.  I called mom back, mom stated she did not call about medication issues, just told Inaya to stop taking Ritalin. Mom stated patient had heart palpitations when she took Ritalin. Mom request Henna go back on the [place] patch or what else could they do?

## 2015-02-28 NOTE — Telephone Encounter (Signed)
Called and left a voice message for call back to the clinic.

## 2015-03-10 ENCOUNTER — Ambulatory Visit (HOSPITAL_COMMUNITY)
Admission: RE | Admit: 2015-03-10 | Discharge: 2015-03-10 | Disposition: A | Discharge status: Home / Self Care | Payer: 59 | Source: Ambulatory Visit | Attending: Psychiatry | Admitting: Psychiatry

## 2015-03-10 DIAGNOSIS — R9431 Abnormal electrocardiogram [ECG] [EKG]: Secondary | ICD-10-CM | POA: Insufficient documentation

## 2015-03-10 DIAGNOSIS — Z5181 Encounter for therapeutic drug level monitoring: Secondary | ICD-10-CM | POA: Insufficient documentation

## 2015-03-10 DIAGNOSIS — Z79899 Other long term (current) drug therapy: Secondary | ICD-10-CM | POA: Insufficient documentation

## 2015-03-20 ENCOUNTER — Telehealth (HOSPITAL_COMMUNITY): Payer: Self-pay

## 2015-03-20 NOTE — Telephone Encounter (Signed)
Telephone call from patient's Mother requesting Dr. Doyne Keel restart patient on [place].  Ms. Fatzinger stated patient cannot take past written Ritalin but needs something and wants to return to taking the Daytrana patch she was previously prescribed. Patient's next evaluation set for 04/25/15.  Ms. Slape requests a call back to her at [phone] or to patient at [phone] although warns patient is bad about not answering her phone.  Attempted to reach patient but no answer and her voicemail had not be set up yet.   Ms. Dula requests Susan Bauer just be restarted if Dr. Doyne Keel would be willing to authorize.

## 2015-03-21 NOTE — Telephone Encounter (Signed)
Telephone message left for patient at [phone] to request patient call this nurse back to discuss her restarting past prescribed Daytrana after discussion with Dr. Doyne Keel who authorized the restart once discussed with patient.  Tried to reach patient at [phone] as well but patient did not answer and her voice mail was not set up to receive a message at this time.  Will restart patient's Daytrana per authorization by Dr. Doyne Keel once restart discussed with patient.

## 2015-03-21 NOTE — Telephone Encounter (Signed)
We can restart Daytrana patch and d/c Ritalin.

## 2015-04-07 ENCOUNTER — Telehealth (HOSPITAL_COMMUNITY): Payer: Self-pay | Admitting: *Deleted

## 2015-04-07 DIAGNOSIS — F988 Other specified behavioral and emotional disorders with onset usually occurring in childhood and adolescence: Secondary | ICD-10-CM

## 2015-04-07 NOTE — Telephone Encounter (Signed)
Mother called and left message on vm that daughter Susan Bauer would like the Daytrana patch.  Mother stated on vm that she calls several times and gets no calls back.   Susan Reeve., RN has phone notes that she tried to reach patient and no phone call was returned from message left on a voice mail.  We do not have a release that I can find in patients chart to speak to the mother.   I left message on cell phone listed in patients chart.  I advised on message that Susan Bauer please call us back because we do not have a release on file to speak with her mother, must have permission from her to speak to mother.

## 2015-04-14 NOTE — Telephone Encounter (Signed)
Patient came in today with her Mother and signed a consent for her mother to speak with [place] when needed with front desk reception.  Patient requested her Daytrana patch now be restarted as she did not like Ritalin previously prescribed.  Patient was informed message would be sent to Dr. Doyne Keel who would need to write out a new prescription.  Dr. Doyne Keel to return to the office on 04/18/15 and patient is to return for her next evaluation on 04/25/15.

## 2015-04-18 MED ORDER — METHYLPHENIDATE 20 MG/9HR TD PTCH: 1.0000 | MEDICATED_PATCH | Freq: Every day | TRANSDERMAL | Status: DC

## 2015-04-18 NOTE — Addendum Note (Signed)
Addended by: Charlcie Cradle on: 04/18/2015 10:50 AM   Modules accepted: Orders, Medications

## 2015-04-20 ENCOUNTER — Telehealth (HOSPITAL_COMMUNITY): Payer: Self-pay

## 2015-04-20 NOTE — Telephone Encounter (Signed)
04/20/15 Patient's mother Johna Roles QN#[id number] came and pick-up rx script.Marland KitchenMariana Kaufman

## 2015-04-25 ENCOUNTER — Ambulatory Visit (INDEPENDENT_AMBULATORY_CARE_PROVIDER_SITE_OTHER): Payer: 59 | Admitting: Psychiatry

## 2015-04-25 ENCOUNTER — Encounter (HOSPITAL_COMMUNITY): Payer: Self-pay | Admitting: Psychiatry

## 2015-04-25 VITALS — BP 126/82 | HR 80 | Ht 68.5 in | Wt 143.0 lb

## 2015-04-25 DIAGNOSIS — F411 Generalized anxiety disorder: Secondary | ICD-10-CM

## 2015-04-25 DIAGNOSIS — F4010 Social phobia, unspecified: Secondary | ICD-10-CM

## 2015-04-25 DIAGNOSIS — F900 Attention-deficit hyperactivity disorder, predominantly inattentive type: Secondary | ICD-10-CM

## 2015-04-25 DIAGNOSIS — F332 Major depressive disorder, recurrent severe without psychotic features: Secondary | ICD-10-CM

## 2015-04-25 DIAGNOSIS — F418 Other specified anxiety disorders: Secondary | ICD-10-CM | POA: Diagnosis not present

## 2015-04-25 DIAGNOSIS — F988 Other specified behavioral and emotional disorders with onset usually occurring in childhood and adolescence: Secondary | ICD-10-CM

## 2015-04-25 MED ORDER — METHYLPHENIDATE 20 MG/9HR TD PTCH: 1.0000 | MEDICATED_PATCH | Freq: Every day | TRANSDERMAL | Status: DC

## 2015-04-25 MED ORDER — FLUOXETINE HCL 20 MG PO CAPS: 60.0000 mg | ORAL_CAPSULE | Freq: Every day | ORAL | Status: DC

## 2015-04-25 MED ORDER — ARIPIPRAZOLE 2 MG PO TABS: 2.0000 mg | ORAL_TABLET | Freq: Every day | ORAL | Status: DC

## 2015-04-25 NOTE — Progress Notes (Signed)
[facility] [phone] Progress Note  GLENYCE RANDLE [id number] 19 y.o.  04/25/2015 4:34 PM  Chief Complaint: doing good  History of Present Illness: Here with mother. Mom states Prozac has made a big difference. Seems happier and is more social. No longer complaining about physical ailments.   Pt took one dose of Ritalin and experienced palpitations, cold sweats and shaking that lasted for about 6-12 hrs. Pt did not take it again. She has since restarted Daytrana. States it is working. Concentration is fair with Daytrona patch. It lasts about 9 hrs. She reports a decreased appetite and some mild skin irritation because she forgets to take off the patch.   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 Pt fainted after being criticized at work. Her anxiety now is social and centered on work. Pt is doing a lot of things with friends- going out of town to a Research scientist (physical sciences) and the pool.  Prozac at higher dose has made some difference with social anxiety. Pt gets anxious before work and is not driving. Pt is able to sleep before work. Denies any recent panic attacks. Pt has only taken 3-4 Ativan since last visit.   Depression is fine and is mostly situational. Energy is good and she no longer feels tired all the time. She is more social with friends. Denies anhedonia, isolation and crying spells and sad mood. She is drawing again. Appetite is ok and she eating 3 meals/day. Most nights sleeping about 6-7 hrs and feels rested when she wake up.  Body image is a little bad and she is worried she can gained weight. Pt is not restricting or purging. She is working on that in therapy.   Pt is taking Abilify as prescribed and denies SE. Feels increased dose of Prozac is helping. Pt has only taken Ativan 3 times since it was prescribed.  Suicidal Ideation: No Plan Formed: No Patient has means to carry out plan: No  Homicidal Ideation: No Plan Formed: No Patient  has means to carry out plan: No  Review of Systems: Psychiatric: Agitation: No Hallucination: No Depressed Mood: No Insomnia: No Hypersomnia: No Altered Concentration: No Feels Worthless: No Grandiose Ideas: No Belief In Special Powers: No New/Increased Substance Abuse: No Compulsions: No  Neurologic: Headache: No Seizure: No Paresthesias: No  Review of Systems  Constitutional: Negative for fever, chills and weight loss.  HENT: Negative for congestion, ear pain and sore throat.   Eyes: Negative for blurred vision, double vision and pain.  Respiratory: Negative for cough, shortness of breath and wheezing.   Cardiovascular: Negative for chest pain, palpitations and leg swelling.  Gastrointestinal: Negative for heartburn, nausea, vomiting and abdominal pain.  Musculoskeletal: Positive for back pain. Negative for joint pain and neck pain.  Skin: Negative for itching and rash.  Neurological: Negative for dizziness, sensory change, seizures, loss of consciousness, weakness and headaches.  Psychiatric/Behavioral: Negative for depression, suicidal ideas, hallucinations and substance abuse. The patient is nervous/anxious. The patient does not have insomnia.      Past Medical Family, Social History: lives with mom. Works at a Psychologist, forensic.   reports that she has never smoked. She has never used smokeless tobacco. She reports that she does not drink alcohol or use illicit drugs.  Family History  Problem Relation Age of Onset  . Depression Mother    Past Medical History  Diagnosis Date  . Wears glasses   . ADHD (attention deficit hyperactivity disorder)   .  Oppositional defiant disorder   . Anxiety   . Depression     Outpatient Encounter Prescriptions as of 04/25/2015  Medication Sig  . ARIPiprazole (ABILIFY) 2 MG tablet Take 1 tablet (2 mg total) by mouth daily.  . Calcium Carbonate-Vitamin D (CALCIUM 600+D HIGH POTENCY) 600-400 MG-UNIT per tablet Take 1 tablet by mouth 2 (two)  times daily.  . Clindamycin Phos-Benzoyl Perox (ONEXTON) 1.2-3.75 % GEL Apply topically.  . ferrous sulfate 325 (65 FE) MG tablet Take 325 mg by mouth daily with breakfast.  . FLUoxetine (PROZAC) 20 MG capsule Take 3 capsules (60 mg total) by mouth daily.  Marland Kitchen LORazepam (ATIVAN) 0.5 MG tablet Take 1 tablet (0.5 mg total) by mouth daily as needed for anxiety or sleep.  . methylphenidate (DAYTRANA) 20 MG/9HR Place 1 patch onto the skin daily. wear patch for 9 hours only each day  . MINOCYCLINE HCL PO Take by mouth.  . Multiple Vitamin (MULITIVITAMIN WITH MINERALS) TABS Take 1 tablet by mouth daily.  . norethindrone-ethinyl estradiol (JUNEL FE,GILDESS FE,LOESTRIN FE) 1-20 MG-MCG tablet Take 1 tablet by mouth daily.   No facility-administered encounter medications on file as of 04/25/2015.    Past Psychiatric History/Hospitalization(s): Anxiety: Yes Bipolar Disorder: No Depression: Yes Mania: No Psychosis: No Schizophrenia: No Personality Disorder: No Hospitalization for psychiatric illness: No History of Electroconvulsive Shock Therapy: No Prior Suicide Attempts: Yes  Physical Exam: Constitutional:  BP 126/82 mmHg  Pulse 80  Ht 5' 8.5" (1.74 m)  Wt 143 lb (64.864 kg)  BMI 21.42 kg/m2  General Appearance: alert, oriented, no acute distress and well nourished  Musculoskeletal: Strength & Muscle Tone: within normal limits Gait & Station: normal Patient leans: N/A  Mental Status Examination/Evaluation: Objective: Attitude: Calm and cooperative  Appearance: Fairly Groomed, appears to be stated age  Eye Contact::  Minimal  Speech:  Clear and Coherent and Normal Rate  Volume:  Normal  Mood:  euthymic  Affect:  Congruent  Thought Process:  Goal Directed, Linear and Logical  Orientation:  Full (Time, Place, and Person)  Thought Content:  Negative  Suicidal Thoughts:  No  Homicidal Thoughts:  No  Judgement:  Fair  Insight:  Fair  Concentration: good  Memory:  Immediate-intact Recent-intact Remote-intact  Recall: fair  Language: fair  Gait and Station: normal  [employer] of Knowledge: average  Psychomotor Activity:  Normal  Akathisia:  No  Handed:  Right  AIMS (if indicated):  Facial and Oral Movements  Muscles of Facial Expression: None, normal  Lips and Perioral Area: None, normal  Jaw: None, normal  Tongue: None, normal Extremity Movements: Upper (arms, wrists, hands, fingers): None, normal  Lower (legs, knees, ankles, toes): None, normal,  Trunk Movements:  Neck, shoulders, hips: None, normal,  Overall Severity : Severity of abnormal movements (highest score from questions above): None, normal  Incapacitation due to abnormal movements: None, normal  Patient's awareness of abnormal movements (rate only patient's report): No Awareness, Dental Status  Current problems with teeth and/or dentures?: No  Does patient usually wear dentures?: No    Assets:  Communication Skills Desire for Improvement Financial Resources/Insurance Housing Leisure Time Physical Health Resilience Social Support Talents/Skills       Medical Decision Making (Choose Three): Established Problem, Stable/Improving (1), Review of Psycho-Social Stressors (1), Review or order clinical lab tests (1) and Review of Medication Regimen & Side Effects (2)  Assessment: Axis I: MDD- recurrent, severe without psychotic features, GAD, Social anxiety disorder, ADHD- inattentive type  Axis II:  deferred    Plan:   Abilify 2mg  po qD for adjunct treatment of mood Prozac 60mg  po for depression  Daytrana Patch 20mg  po qD for concentration Ativan 0.5mg  po qD prn anxiety  Medication management with supportive therapy. Risks/benefits and SE of the medication discussed. Pt verbalized understanding and verbal consent obtained for treatment.  Affirm with the patient that the medications are taken as ordered. Patient expressed understanding of how their medications were to  be used.  -improvement of symptoms  Labs- reviewed labs from PCP 07/08/2014  CBC WNL, CMP WNL, TSH WNL, Prolactic WNL, reviewed EKG 03/10/2015 QTc 441  Therapy: brief supportive therapy provided. Discussed psychosocial stressors in detail.   Encouraged to continue individual therapy with Dr. Ernestene Mention  Pt denies SI and is at an acute low risk for suicide.Patient told to call clinic if any problems occur. Patient advised to go to ER if they should develop SI/HI, side effects, or if symptoms worsen. Has crisis numbers to call if needed. Pt verbalized understanding.  F/up in 5 months or sooner if needed  Charlcie Cradle, MD 04/25/2015

## 2015-09-11 ENCOUNTER — Telehealth (HOSPITAL_COMMUNITY): Payer: Self-pay

## 2015-09-11 DIAGNOSIS — F988 Other specified behavioral and emotional disorders with onset usually occurring in childhood and adolescence: Secondary | ICD-10-CM

## 2015-09-11 NOTE — Telephone Encounter (Signed)
Medication refill - Telephone call with pt to inform this nurse received their call with refill request for Daytrana.  Informed Dr. Doyne Keel would be back in the office on 09/12/15 and would send request for refill.  Will call back once order prepared.  Patient returns for next scheduled evaluation on 09/28/15.

## 2015-09-12 MED ORDER — METHYLPHENIDATE 20 MG/9HR TD PTCH: 1.0000 | MEDICATED_PATCH | Freq: Every day | TRANSDERMAL | Status: DC

## 2015-09-12 NOTE — Telephone Encounter (Signed)
Yes we can refill 

## 2015-09-12 NOTE — Telephone Encounter (Signed)
Patient's Daytrana prescription printed, reviewed and signed by Dr. Doyne Keel.  Message left for patient her new Daytrana order was prepared for pick up.

## 2015-09-18 ENCOUNTER — Telehealth (HOSPITAL_COMMUNITY): Payer: Self-pay

## 2015-09-18 NOTE — Telephone Encounter (Signed)
09/18/15 4:58pm Pt's mother Rodnisha Blomgren WS#[id number] came and pick-up rx script.Marland KitchenMariana Kaufman

## 2015-09-28 ENCOUNTER — Ambulatory Visit (HOSPITAL_COMMUNITY): Payer: Self-pay | Admitting: Psychiatry

## 2015-10-31 ENCOUNTER — Encounter (HOSPITAL_COMMUNITY): Payer: Self-pay | Admitting: Psychiatry

## 2015-10-31 ENCOUNTER — Ambulatory Visit (INDEPENDENT_AMBULATORY_CARE_PROVIDER_SITE_OTHER): Payer: 59 | Admitting: Psychiatry

## 2015-10-31 VITALS — BP 109/70 | HR 64 | Ht 68.5 in | Wt 148.6 lb

## 2015-10-31 DIAGNOSIS — F411 Generalized anxiety disorder: Secondary | ICD-10-CM | POA: Diagnosis not present

## 2015-10-31 DIAGNOSIS — F332 Major depressive disorder, recurrent severe without psychotic features: Secondary | ICD-10-CM

## 2015-10-31 DIAGNOSIS — F988 Other specified behavioral and emotional disorders with onset usually occurring in childhood and adolescence: Secondary | ICD-10-CM

## 2015-10-31 DIAGNOSIS — F909 Attention-deficit hyperactivity disorder, unspecified type: Secondary | ICD-10-CM | POA: Diagnosis not present

## 2015-10-31 DIAGNOSIS — F4010 Social phobia, unspecified: Secondary | ICD-10-CM | POA: Diagnosis not present

## 2015-10-31 MED ORDER — METHYLPHENIDATE 20 MG/9HR TD PTCH: 1.0000 | MEDICATED_PATCH | Freq: Every day | TRANSDERMAL | Status: DC

## 2015-10-31 MED ORDER — ARIPIPRAZOLE 2 MG PO TABS: 2.0000 mg | ORAL_TABLET | Freq: Every day | ORAL | Status: DC

## 2015-10-31 MED ORDER — FLUOXETINE HCL 20 MG PO CAPS: 60.0000 mg | ORAL_CAPSULE | Freq: Every day | ORAL | Status: DC

## 2015-10-31 NOTE — Progress Notes (Signed)
Patient ID: Susan Bauer, female   DOB: 1996-05-21, 19 y.o.   MRN: [id number]  [place] Progress Note  Susan Bauer [id number] 19 y.o.  10/31/2015 11:44 AM  Chief Complaint: doing pretty good  History of Present Illness: Here with mother. Mom states pt is doing well. Pt is working on Materials engineer.   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  No concerns today.   Pt is using the Daytrana Patch on weekdays and has only SE of skin irritation.   Depression is fine and is mostly situational. Energy is good. She is more social with friends. Denies anhedonia, isolation and crying spells and sad mood. Appetite is ok and she eating 3 meals/day. Most nights sleeping about 6-7 hrs and feels rested when she wake up. Her kitten keeps her up at night.   Body image is a little bad and she is worried she has gained weight (20 lbs since 1 yr). Pt is not restricting or purging. She is working on that in therapy.   Anxiety is "good". She is making attempts talking to strangers and in crowds.   Pt is taking Abilify and Prozac as prescribed and denies SE.  Pt has only taken Ativan 3 times since it was prescribed. Mom reminds pt to take pills daily as prescribed.   Suicidal Ideation: No Plan Formed: No Patient has means to carry out plan: No  Homicidal Ideation: No Plan Formed: No Patient has means to carry out plan: No  Review of Systems: Psychiatric: Agitation: No Hallucination: No Depressed Mood: No Insomnia: No Hypersomnia: No Altered Concentration: No Feels Worthless: No Grandiose Ideas: No Belief In Special Powers: No New/Increased Substance Abuse: No Compulsions: No  Neurologic: Headache: No Seizure: No Paresthesias: No  Review of Systems  Constitutional: Negative for fever, chills and weight loss.  HENT: Negative for congestion, ear pain and sore throat.   Eyes: Negative for blurred vision, double vision and pain.   Respiratory: Negative for cough, shortness of breath and wheezing.   Cardiovascular: Negative for chest pain, palpitations and leg swelling.  Gastrointestinal: Negative for heartburn, nausea, vomiting and abdominal pain.  Musculoskeletal: Positive for back pain. Negative for joint pain and neck pain.  Skin: Negative for itching and rash.  Neurological: Negative for dizziness, sensory change, seizures, loss of consciousness, weakness and headaches.  Psychiatric/Behavioral: Negative for depression, suicidal ideas, hallucinations and substance abuse. The patient is not nervous/anxious and does not have insomnia.      Past Medical Family, Social History: lives with mom. Works at a Psychologist, forensic.   reports that she has never smoked. She has never used smokeless tobacco. She reports that she does not drink alcohol or use illicit drugs.  Family History  Problem Relation Age of Onset  . Depression Mother    Past Medical History  Diagnosis Date  . Wears glasses   . ADHD (attention deficit hyperactivity disorder)   . Oppositional defiant disorder   . Anxiety   . Depression     Outpatient Encounter Prescriptions as of 10/31/2015  Medication Sig  . ARIPiprazole (ABILIFY) 2 MG tablet Take 1 tablet (2 mg total) by mouth daily.  . Calcium Carbonate-Vitamin D (CALCIUM 600+D HIGH POTENCY) 600-400 MG-UNIT per tablet Take 1 tablet by mouth 2 (two) times daily.  . Clindamycin Phos-Benzoyl Perox (ONEXTON) 1.2-3.75 % GEL Apply topically.  Marland Kitchen FLUoxetine (PROZAC) 20 MG capsule Take 3 capsules (60 mg total) by mouth  daily.  Marland Kitchen LORazepam (ATIVAN) 0.5 MG tablet Take 1 tablet (0.5 mg total) by mouth daily as needed for anxiety or sleep.  . methylphenidate (DAYTRANA) 20 MG/9HR Place 1 patch onto the skin daily. wear patch for 9 hours only each day  . MINOCYCLINE HCL PO Take by mouth.  . Multiple Vitamin (MULITIVITAMIN WITH MINERALS) TABS Take 1 tablet by mouth daily.  . norethindrone-ethinyl estradiol (JUNEL  FE,GILDESS FE,LOESTRIN FE) 1-20 MG-MCG tablet Take 1 tablet by mouth daily.  . ferrous sulfate 325 (65 FE) MG tablet Take 325 mg by mouth daily with breakfast. Reported on 10/31/2015   No facility-administered encounter medications on file as of 10/31/2015.    Past Psychiatric History/Hospitalization(s): Anxiety: Yes Bipolar Disorder: No Depression: Yes Mania: No Psychosis: No Schizophrenia: No Personality Disorder: No Hospitalization for psychiatric illness: No History of Electroconvulsive Shock Therapy: No Prior Suicide Attempts: Yes  Physical Exam: Constitutional:  BP 109/70 mmHg  Pulse 64  Ht 5' 8.5" (1.74 m)  Wt 148 lb 9.6 oz (67.405 kg)  BMI 22.26 kg/m2  General Appearance: alert, oriented, no acute distress and well nourished  Musculoskeletal: Strength & Muscle Tone: within normal limits Gait & Station: normal Patient leans: N/A  Mental Status Examination/Evaluation: Objective: Attitude: Calm and cooperative  Appearance: Fairly Groomed, appears to be stated age  Engineer, water::  Fair  Speech:  Clear and Coherent and Normal Rate  Volume:  Normal  Mood:  euthymic  Affect:  Constricted  Thought Process:  Goal Directed, Linear and Logical  Orientation:  Full (Time, Place, and Person)  Thought Content:  Negative  Suicidal Thoughts:  No  Homicidal Thoughts:  No  Judgement:  Fair  Insight:  Fair  Concentration: good  Memory: Immediate-intact Recent-intact Remote-intact  Recall: fair  Language: fair  Gait and Station: normal  [employer] of Knowledge: average  Psychomotor Activity:  Normal  Akathisia:  No  Handed:  Right  AIMS (if indicated):  Facial and Oral Movements  Muscles of Facial Expression: None, normal  Lips and Perioral Area: None, normal  Jaw: None, normal  Tongue: None, normal Extremity Movements: Upper (arms, wrists, hands, fingers): None, normal  Lower (legs, knees, ankles, toes): None, normal,  Trunk Movements:  Neck, shoulders, hips:  None, normal,  Overall Severity : Severity of abnormal movements (highest score from questions above): None, normal  Incapacitation due to abnormal movements: None, normal  Patient's awareness of abnormal movements (rate only patient's report): No Awareness, Dental Status  Current problems with teeth and/or dentures?: No  Does patient usually wear dentures?: No    Assets:  Communication Skills Desire for Improvement Financial Resources/Insurance Housing Leisure Time Physical Health Resilience Social Support Talents/Skills       Medical Decision Making (Choose Three): Established Problem, Stable/Improving (1), Review of Psycho-Social Stressors (1) and Review of Medication Regimen & Side Effects (2)  Assessment: Axis I: MDD- recurrent, severe without psychotic features, GAD, Social anxiety disorder, ADHD- inattentive type  Axis II: deferred    Plan:   Abilify 2mg  po qD for adjunct treatment of mood Prozac 60mg  po for depression  Daytrana Patch 20mg  po qD for concentration Ativan 0.5mg  po qD prn anxiety  Medication management with supportive therapy. Risks/benefits and SE of the medication discussed. Pt verbalized understanding and verbal consent obtained for treatment.  Affirm with the patient that the medications are taken as ordered. Patient expressed understanding of how their medications were to be used.  -improvement of symptoms  Labs- reviewed labs from PCP  07/08/2014  CBC WNL, CMP WNL, TSH WNL, Prolactic WNL, reviewed EKG 03/10/2015 QTc 441  Labs at next appt  Therapy: brief supportive therapy provided. Discussed psychosocial stressors in detail.   Encouraged to continue individual therapy with Emmit Alexanders on a weekly basis  Pt denies SI and is at an acute low risk for suicide.Patient told to call clinic if any problems occur. Patient advised to go to ER if they should develop SI/HI, side effects, or if symptoms worsen. Has crisis numbers to call if needed. Pt  verbalized understanding.  F/up in 3 months or sooner if needed  Charlcie Cradle, MD 10/31/2015

## 2015-11-17 MED FILL — FLUoxetine HCL 20 MG CAPS: 20 | 30 days supply | Qty: 90 | Fill #2

## 2015-11-20 MED FILL — ARIPiprazole 2 MG TABS: 2 | 30 days supply | Qty: 30 | Fill #1

## 2015-12-11 MED FILL — DAYTRANA 20 MG/9 HOUR PATCH: 20 | 30 days supply | Qty: 30 | Fill #0

## 2016-01-30 ENCOUNTER — Ambulatory Visit (HOSPITAL_COMMUNITY): Payer: Self-pay | Admitting: Psychiatry

## 2016-02-12 MED FILL — MINOCYCLINE HCL 50 MG TAB: 50 | 30 days supply | Qty: 60 | Fill #0

## 2016-02-12 MED FILL — DAYTRANA 20 MG/9 HOUR PATCH: 20 | 30 days supply | Qty: 30 | Fill #0

## 2016-02-12 MED FILL — FLUoxetine HCL 20 MG CAPS: 20 | 90 days supply | Qty: 270 | Fill #0

## 2016-02-12 MED FILL — ARIPiprazole 2 MG TABS: 2 | 90 days supply | Qty: 90 | Fill #0

## 2016-02-13 ENCOUNTER — Encounter (HOSPITAL_COMMUNITY): Payer: Self-pay | Admitting: Psychiatry

## 2016-02-13 ENCOUNTER — Ambulatory Visit (INDEPENDENT_AMBULATORY_CARE_PROVIDER_SITE_OTHER): Payer: 59 | Admitting: Psychiatry

## 2016-02-13 VITALS — BP 118/64 | HR 70 | Ht 68.5 in | Wt 158.2 lb

## 2016-02-13 DIAGNOSIS — F4010 Social phobia, unspecified: Secondary | ICD-10-CM | POA: Diagnosis not present

## 2016-02-13 DIAGNOSIS — F332 Major depressive disorder, recurrent severe without psychotic features: Secondary | ICD-10-CM | POA: Diagnosis not present

## 2016-02-13 DIAGNOSIS — F411 Generalized anxiety disorder: Secondary | ICD-10-CM

## 2016-02-13 DIAGNOSIS — F909 Attention-deficit hyperactivity disorder, unspecified type: Secondary | ICD-10-CM

## 2016-02-13 DIAGNOSIS — F988 Other specified behavioral and emotional disorders with onset usually occurring in childhood and adolescence: Secondary | ICD-10-CM

## 2016-02-13 DIAGNOSIS — Z79899 Other long term (current) drug therapy: Secondary | ICD-10-CM

## 2016-02-13 MED ORDER — METHYLPHENIDATE 20 MG/9HR TD PTCH: 1.0000 | MEDICATED_PATCH | Freq: Every day | TRANSDERMAL | Status: DC

## 2016-02-13 MED ORDER — FLUOXETINE HCL 20 MG PO CAPS: 60.0000 mg | ORAL_CAPSULE | Freq: Every day | ORAL | Status: DC

## 2016-02-13 MED ORDER — ARIPIPRAZOLE 2 MG PO TABS: 2.0000 mg | ORAL_TABLET | Freq: Every day | ORAL | Status: DC

## 2016-02-13 NOTE — Progress Notes (Signed)
Patient ID: Susan Bauer, female   DOB: [birth year], 20 y.o.   MRN: [id number] Patient ID: Susan Bauer, female   DOB: 10/03/1996, 20 y.o.   MRN: [id number]  [facility] [id number] Progress Note  Susan Bauer [id number] 20 y.o.  02/13/2016 10:16 AM  Chief Complaint: doing pretty good  History of Present Illness: Got a new therapist that pt feels is a better fit.   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  No concerns today.   Pt is using the Daytrana Patch on weekdays and has only SE of skin irritation. Pt puts it on at noon and takes it off at 6:30pm. The effect wears off at 10pm and can sometimes affect her sleep.   Depression is fine and is mostly situational. Energy is good. She is more social with friends. Denies anhedonia, isolation and crying spells and sad mood. Appetite is ok and she eating 3 meals/day. Most nights sleeping about 6-7 hrs and feels rested when she wake up. .   Body image is a little bad and she is worried she has gained weight (20 lbs since 1 yr). Pt is not restricting or purging. She is working on that in therapy.   Anxiety is mild and pt is learning to drive. She is making attempts talking to strangers and go out in crowds.   Pt is taking Abilify and Prozac as prescribed and denies SE.  Pt has only taken Ativan 3 times since it was prescribed. Mom reminds pt to take pills daily as prescribed.   Suicidal Ideation: No Plan Formed: No Patient has means to carry out plan: No  Homicidal Ideation: No Plan Formed: No Patient has means to carry out plan: No  Review of Systems: Psychiatric: Agitation: No Hallucination: No Depressed Mood: No Insomnia: No Hypersomnia: No Altered Concentration: No Feels Worthless: No Grandiose Ideas: No Belief In Special Powers: No New/Increased Substance Abuse: No Compulsions: No  Neurologic: Headache: No Seizure: No Paresthesias: No  Review of Systems  Constitutional: Negative for  fever, chills and weight loss.  HENT: Negative for congestion, ear pain and sore throat.   Eyes: Negative for blurred vision, double vision and pain.  Respiratory: Negative for cough, shortness of breath and wheezing.   Cardiovascular: Negative for chest pain, palpitations and leg swelling.  Gastrointestinal: Negative for heartburn, nausea, vomiting and abdominal pain.  Musculoskeletal: Negative for back pain, joint pain and neck pain.  Skin: Negative for itching and rash.  Neurological: Negative for dizziness, sensory change, seizures, loss of consciousness, weakness and headaches.  Psychiatric/Behavioral: Negative for depression, suicidal ideas, hallucinations and substance abuse. The patient is not nervous/anxious and does not have insomnia.      Past Medical Family, Social History: lives with mom. Works at a [employer], [employer].   reports that she has never smoked. She has never used smokeless tobacco. She reports that she does not drink alcohol or use illicit drugs.  Family History  Problem Relation Age of Onset  . Depression Mother    Past Medical History  Diagnosis Date  . Wears glasses   . ADHD (attention deficit hyperactivity disorder)   . Oppositional defiant disorder   . Anxiety   . Depression     Outpatient Encounter Prescriptions as of 02/13/2016  Medication Sig  . ARIPiprazole (ABILIFY) 2 MG tablet Take 1 tablet (2 mg total) by mouth daily.  . Calcium Carbonate-Vitamin D (CALCIUM 600+D HIGH POTENCY) 600-400 MG-UNIT per  tablet Take 1 tablet by mouth 2 (two) times daily.  . Clindamycin Phos-Benzoyl Perox (ONEXTON) 1.2-3.75 % GEL Apply topically.  Marland Kitchen FLUoxetine (PROZAC) 20 MG capsule Take 3 capsules (60 mg total) by mouth daily.  . methylphenidate (DAYTRANA) 20 MG/9HR Place 1 patch onto the skin daily. wear patch for 9 hours only each day  . methylphenidate (DAYTRANA) 20 MG/9HR Place 1 patch onto the skin daily. wear patch for 9 hours only each day  . methylphenidate (DAYTRANA) 20  MG/9HR Place 1 patch onto the skin daily. wear patch for 9 hours only each day  . MINOCYCLINE HCL PO Take by mouth.  . Multiple Vitamin (MULITIVITAMIN WITH MINERALS) TABS Take 1 tablet by mouth daily.  . norethindrone-ethinyl estradiol (JUNEL FE,GILDESS FE,LOESTRIN FE) 1-20 MG-MCG tablet Take 1 tablet by mouth daily.  . ferrous sulfate 325 (65 FE) MG tablet Take 325 mg by mouth daily with breakfast. Reported on 02/13/2016   No facility-administered encounter medications on file as of 02/13/2016.    Past Psychiatric History/Hospitalization(s): Anxiety: Yes Bipolar Disorder: No Depression: Yes Mania: No Psychosis: No Schizophrenia: No Personality Disorder: No Hospitalization for psychiatric illness: No History of Electroconvulsive Shock Therapy: No Prior Suicide Attempts: Yes  Physical Exam: Constitutional:  BP 118/64 mmHg  Pulse 70  Ht 5' 8.5" (1.74 m)  Wt 158 lb 3.2 oz (71.759 kg)  BMI 23.70 kg/m2  General Appearance: alert, oriented, no acute distress and well nourished  Musculoskeletal: Strength & Muscle Tone: within normal limits Gait & Station: normal Patient leans: straight  Mental Status Examination/Evaluation: Objective: Attitude: Calm and cooperative  Appearance: Fairly Groomed, appears to be stated age  Engineer, water::  Fair  Speech:  Clear and Coherent and Normal Rate  Volume:  Normal  Mood:  euthymic  Affect:  Constricted  Thought Process:  Goal Directed, Linear and Logical  Orientation:  Full (Time, Place, and Person)  Thought Content:  Negative  Suicidal Thoughts:  No  Homicidal Thoughts:  No  Judgement:  Fair  Insight:  Fair  Concentration: good  Memory: Immediate-intact Recent-intact Remote-intact  Recall: fair  Language: fair  Gait and Station: normal  [employer] of Knowledge: average  Psychomotor Activity:  Normal  Akathisia:  No  Handed:  Right  AIMS (if indicated):  Facial and Oral Movements  Muscles of Facial Expression: None, normal   Lips and Perioral Area: None, normal  Jaw: None, normal  Tongue: None, normal Extremity Movements: Upper (arms, wrists, hands, fingers): None, normal  Lower (legs, knees, ankles, toes): None, normal,  Trunk Movements:  Neck, shoulders, hips: None, normal,  Overall Severity : Severity of abnormal movements (highest score from questions above): None, normal  Incapacitation due to abnormal movements: None, normal  Patient's awareness of abnormal movements (rate only patient's report): No Awareness, Dental Status  Current problems with teeth and/or dentures?: No  Does patient usually wear dentures?: No    Assets:  Communication Skills Desire for Improvement Financial Resources/Insurance Housing Leisure Time Physical Health Resilience Social Support Talents/Skills       Medical Decision Making (Choose Three): Established Problem, Stable/Improving (1), Review of Psycho-Social Stressors (1), Review or order clinical lab tests (1) and Review of Medication Regimen & Side Effects (2)  Assessment: Axis I: MDD- recurrent, severe without psychotic features, GAD, Social anxiety disorder, ADHD- inattentive type  Axis II: deferred    Plan:   Abilify 2mg  po qD for adjunct treatment of mood Prozac 60mg  po for depression  Daytrana Patch 20mg  po qD  for concentration Ativan 0.5mg  po qD prn anxiety  Medication management with supportive therapy. Risks/benefits and SE of the medication discussed. Pt verbalized understanding and verbal consent obtained for treatment.  Affirm with the patient that the medications are taken as ordered. Patient expressed understanding of how their medications were to be used.  -improvement of symptoms  Labs- reviewed labs from PCP 07/08/2014  CBC WNL, CMP WNL, TSH WNL, Prolactic WNL, reviewed EKG 03/10/2015 QTc 441  Labs ordered- CBC, CMP, HbA1c, Lipid panel, TSH, Prolactin level, EKG   Therapy: brief supportive therapy provided. Discussed psychosocial  stressors in detail.   Encouraged to continue individual therapy   Pt denies SI and is at an acute low risk for suicide.Patient told to call clinic if any problems occur. Patient advised to go to ER if they should develop SI/HI, side effects, or if symptoms worsen. Has crisis numbers to call if needed. Pt verbalized understanding.  F/up in 3 months or sooner if needed  Charlcie Cradle, MD 02/13/2016

## 2016-02-13 NOTE — Patient Instructions (Signed)
1. EKG call [phone] 2. labs

## 2016-02-19 ENCOUNTER — Other Ambulatory Visit (HOSPITAL_COMMUNITY): Payer: Self-pay

## 2016-02-19 DIAGNOSIS — M542 Cervicalgia: Secondary | ICD-10-CM | POA: Diagnosis not present

## 2016-02-19 DIAGNOSIS — M545 Low back pain: Secondary | ICD-10-CM | POA: Diagnosis not present

## 2016-02-19 DIAGNOSIS — M546 Pain in thoracic spine: Secondary | ICD-10-CM | POA: Diagnosis not present

## 2016-02-19 DIAGNOSIS — M9901 Segmental and somatic dysfunction of cervical region: Secondary | ICD-10-CM | POA: Diagnosis not present

## 2016-02-27 DIAGNOSIS — Z01419 Encounter for gynecological examination (general) (routine) without abnormal findings: Secondary | ICD-10-CM | POA: Diagnosis not present

## 2016-02-27 DIAGNOSIS — Z6825 Body mass index (BMI) 25.0-25.9, adult: Secondary | ICD-10-CM | POA: Diagnosis not present

## 2016-02-27 MED FILL — LARIN FE 1-20 TABLET: 1-20 | 28 days supply | Qty: 28 | Fill #0

## 2016-03-08 DIAGNOSIS — F322 Major depressive disorder, single episode, severe without psychotic features: Secondary | ICD-10-CM | POA: Diagnosis not present

## 2016-03-08 DIAGNOSIS — Z23 Encounter for immunization: Secondary | ICD-10-CM | POA: Diagnosis not present

## 2016-03-08 DIAGNOSIS — Z0000 Encounter for general adult medical examination without abnormal findings: Secondary | ICD-10-CM | POA: Diagnosis not present

## 2016-03-08 DIAGNOSIS — Z79899 Other long term (current) drug therapy: Secondary | ICD-10-CM | POA: Diagnosis not present

## 2016-03-22 DIAGNOSIS — H5213 Myopia, bilateral: Secondary | ICD-10-CM | POA: Diagnosis not present

## 2016-04-11 MED FILL — DAYTRANA 20 MG/9 HOUR PATCH: 20 | 30 days supply | Qty: 30 | Fill #0

## 2016-04-22 MED FILL — NORETHIN-ESTRAD-FERR 1-0.02: 1-20 | 28 days supply | Qty: 28 | Fill #1

## 2016-05-10 DIAGNOSIS — Z23 Encounter for immunization: Secondary | ICD-10-CM | POA: Diagnosis not present

## 2016-05-16 ENCOUNTER — Ambulatory Visit (INDEPENDENT_AMBULATORY_CARE_PROVIDER_SITE_OTHER): Payer: 59 | Admitting: Psychiatry

## 2016-05-16 ENCOUNTER — Encounter (HOSPITAL_COMMUNITY): Payer: Self-pay | Admitting: Psychiatry

## 2016-05-16 VITALS — BP 124/64 | HR 80 | Ht 68.6 in | Wt 150.0 lb

## 2016-05-16 DIAGNOSIS — F332 Major depressive disorder, recurrent severe without psychotic features: Secondary | ICD-10-CM

## 2016-05-16 DIAGNOSIS — F909 Attention-deficit hyperactivity disorder, unspecified type: Secondary | ICD-10-CM

## 2016-05-16 DIAGNOSIS — F411 Generalized anxiety disorder: Secondary | ICD-10-CM

## 2016-05-16 DIAGNOSIS — F4010 Social phobia, unspecified: Secondary | ICD-10-CM | POA: Diagnosis not present

## 2016-05-16 DIAGNOSIS — F988 Other specified behavioral and emotional disorders with onset usually occurring in childhood and adolescence: Secondary | ICD-10-CM

## 2016-05-16 MED ORDER — FLUOXETINE HCL 20 MG PO CAPS: 60.0000 mg | ORAL_CAPSULE | Freq: Every day | ORAL | Status: DC

## 2016-05-16 MED ORDER — METHYLPHENIDATE 20 MG/9HR TD PTCH: 1.0000 | MEDICATED_PATCH | Freq: Every day | TRANSDERMAL | Status: DC

## 2016-05-16 MED ORDER — ARIPIPRAZOLE 2 MG PO TABS: 2.0000 mg | ORAL_TABLET | Freq: Every day | ORAL | Status: DC

## 2016-05-16 MED FILL — ARIPiprazole 2 MG TABS: 2 | 90 days supply | Qty: 90 | Fill #0

## 2016-05-16 MED FILL — FLUoxetine HCL 20 MG CAPS: 20 | 90 days supply | Qty: 270 | Fill #0

## 2016-05-16 NOTE — Progress Notes (Signed)
Patient ID: KATARENA FINER, female   DOB: 23-Mar-1996, 20 y.o.   MRN: [id number] Patient ID: JAZABELLA BRUSH, female   DOB: Apr 30, 1996, 20 y.o.   MRN: [id number] Patient ID: CAILAH ZELENKA, female   DOB: [birth year], 20 y.o.   MRN: [id number]  [facility] [id number] Progress Note  NAZ LARROW [id number] 20 y.o.  05/16/2016 10:20 AM  Chief Complaint: doing pretty good  History of Present Illness: Got a new therapist that pt feels is a better fit.   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 Home is good and pt is busy with friends.   No concerns today.   Pt is using the Daytrana Patch on weekdays and has only SE of skin irritation. Pt puts it on at noon and takes it off at 6:30pm. The effect wears off at 10pm and can sometimes affect her sleep.   Depression is fine and is mostly situational. Energy is good. She is more social with friends. Denies anhedonia, isolation and crying spells and sad mood. Appetite is ok and she eating 3 meals/day. Most nights sleeping about 6-7 hrs and feels rested when she wake up. .   Body image is a little better since last visit. States this is better due to now having a boyfriend who finds her attractive. Pt is not restricting or purging. She is working on that in therapy.   Anxiety is mild and pt is learning to drive. Social anxiety is at "an all time low right now". She is making attempts talking to strangers and go out in crowds.She is spending time with people and dealing with it well.   Pt is taking Abilify and Prozac as prescribed and denies SE.  Pt has only taken Ativan 3 times since it was prescribed. Mom reminds pt to take pills daily as prescribed.   Suicidal Ideation: No Plan Formed: No Patient has means to carry out plan: No  Homicidal Ideation: No Plan Formed: No Patient has means to carry out plan: No  Review of Systems: Psychiatric: Agitation: No Hallucination: No Depressed Mood: No Insomnia: No Hypersomnia:  No Altered Concentration: No Feels Worthless: No Grandiose Ideas: No Belief In Special Powers: No New/Increased Substance Abuse: No Compulsions: No  Neurologic: Headache: No Seizure: No Paresthesias: No  Review of Systems  Constitutional: Negative for fever, chills and weight loss.  HENT: Negative for congestion, ear pain and sore throat.   Eyes: Negative for blurred vision, double vision and pain.  Respiratory: Negative for cough, shortness of breath and wheezing.   Cardiovascular: Negative for chest pain, palpitations and leg swelling.  Gastrointestinal: Negative for heartburn, nausea, vomiting and abdominal pain.  Musculoskeletal: Negative for back pain, joint pain and neck pain.  Skin: Negative for itching and rash.  Neurological: Negative for dizziness, sensory change, seizures, loss of consciousness, weakness and headaches.  Psychiatric/Behavioral: Negative for depression, suicidal ideas, hallucinations and substance abuse. The patient is not nervous/anxious and does not have insomnia.      Past Medical Family, Social History: lives with mom. Works at a [employer], [employer].   reports that she has never smoked. She has never used smokeless tobacco. She reports that she does not drink alcohol or use illicit drugs.  Family History  Problem Relation Age of Onset  . Depression Mother    Past Medical History  Diagnosis Date  . Wears glasses   . ADHD (attention deficit hyperactivity disorder)   . Oppositional defiant  disorder   . Anxiety   . Depression     Outpatient Encounter Prescriptions as of 05/16/2016  Medication Sig  . ARIPiprazole (ABILIFY) 2 MG tablet Take 1 tablet (2 mg total) by mouth daily.  . Calcium Carbonate-Vitamin D (CALCIUM 600+D HIGH POTENCY) 600-400 MG-UNIT per tablet Take 1 tablet by mouth 2 (two) times daily.  . Clindamycin Phos-Benzoyl Perox (ONEXTON) 1.2-3.75 % GEL Apply topically.  . ferrous sulfate 325 (65 FE) MG tablet Take 325 mg by mouth daily with  breakfast. Reported on 02/13/2016  . FLUoxetine (PROZAC) 20 MG capsule Take 3 capsules (60 mg total) by mouth daily.  . methylphenidate (DAYTRANA) 20 MG/9HR Place 1 patch onto the skin daily. wear patch for 9 hours only each day  . methylphenidate (DAYTRANA) 20 MG/9HR Place 1 patch onto the skin daily. wear patch for 9 hours only each day  . methylphenidate (DAYTRANA) 20 MG/9HR Place 1 patch onto the skin daily. wear patch for 9 hours only each day  . MINOCYCLINE HCL PO Take by mouth.  . Multiple Vitamin (MULITIVITAMIN WITH MINERALS) TABS Take 1 tablet by mouth daily.  . norethindrone-ethinyl estradiol (JUNEL FE,GILDESS FE,LOESTRIN FE) 1-20 MG-MCG tablet Take 1 tablet by mouth daily.   No facility-administered encounter medications on file as of 05/16/2016.    Past Psychiatric History/Hospitalization(s): Anxiety: Yes Bipolar Disorder: No Depression: Yes Mania: No Psychosis: No Schizophrenia: No Personality Disorder: No Hospitalization for psychiatric illness: No History of Electroconvulsive Shock Therapy: No Prior Suicide Attempts: Yes  Physical Exam: Constitutional:  BP 124/64 mmHg  Pulse 80  Ht 5' 8.6" (1.742 m)  Wt 150 lb (68.04 kg)  BMI 22.42 kg/m2  General Appearance: alert, oriented, no acute distress and well nourished  Musculoskeletal: Strength & Muscle Tone: within normal limits Gait & Station: normal Patient leans: straight  Mental Status Examination/Evaluation: Objective: Attitude: Calm and cooperative  Appearance: Fairly Groomed, appears to be stated age  Engineer, water::  Fair  Speech:  Clear and Coherent and Normal Rate  Volume:  Normal  Mood:  euthymic  Affect:  Constricted  Thought Process:  Goal Directed, Linear and Logical  Orientation:  Full (Time, Place, and Person)  Thought Content:  Negative  Suicidal Thoughts:  No  Homicidal Thoughts:  No  Judgement:  Fair  Insight:  Fair  Concentration: good  Memory:  Immediate-intact Recent-intact Remote-intact  Recall: fair  Language: fair  Gait and Station: normal  [employer] of Knowledge: average  Psychomotor Activity:  Normal  Akathisia:  No  Handed:  Right  AIMS (if indicated):  Facial and Oral Movements  Muscles of Facial Expression: None, normal  Lips and Perioral Area: None, normal  Jaw: None, normal  Tongue: None, normal Extremity Movements: Upper (arms, wrists, hands, fingers): None, normal  Lower (legs, knees, ankles, toes): None, normal,  Trunk Movements:  Neck, shoulders, hips: None, normal,  Overall Severity : Severity of abnormal movements (highest score from questions above): None, normal  Incapacitation due to abnormal movements: None, normal  Patient's awareness of abnormal movements (rate only patient's report): No Awareness, Dental Status  Current problems with teeth and/or dentures?: No  Does patient usually wear dentures?: No    Assets:  Communication Skills Desire for Improvement Financial Resources/Insurance Housing Leisure Time Physical Health Resilience Social Support Talents/Skills       Medical Decision Making (Choose Three): Established Problem, Stable/Improving (1), Review of Psycho-Social Stressors (1) and Review of Medication Regimen & Side Effects (2)  Assessment: Axis I: MDD- recurrent,  severe without psychotic features, GAD, Social anxiety disorder, ADHD- inattentive type  Axis II: deferred    Plan:   Abilify 2mg  po qD for adjunct treatment of mood Prozac 60mg  po for depression  Daytrana Patch 20mg  po qD for concentration Ativan 0.5mg  po qD prn anxiety  Medication management with supportive therapy. Risks/benefits and SE of the medication discussed. Pt verbalized understanding and verbal consent obtained for treatment.  Affirm with the patient that the medications are taken as ordered. Patient expressed understanding of how their medications were to be used.  -improvement of  symptoms  Labs- reviewed labs from PCP 07/08/2014  CBC WNL, CMP WNL, TSH WNL, Prolactic WNL, reviewed EKG 03/10/2015 QTc 441  Labs ordered- CBC, CMP, HbA1c, Lipid panel, TSH, Prolactin level, EKG   Therapy: brief supportive therapy provided. Discussed psychosocial stressors in detail.   Encouraged to continue individual therapy   Pt denies SI and is at an acute low risk for suicide.Patient told to call clinic if any problems occur. Patient advised to go to ER if they should develop SI/HI, side effects, or if symptoms worsen. Has crisis numbers to call if needed. Pt verbalized understanding.  F/up in 3 months or sooner if needed  Charlcie Cradle, MD 05/16/2016

## 2016-05-17 ENCOUNTER — Other Ambulatory Visit (HOSPITAL_COMMUNITY): Payer: Self-pay | Admitting: Family Medicine

## 2016-05-17 DIAGNOSIS — R1013 Epigastric pain: Secondary | ICD-10-CM | POA: Diagnosis not present

## 2016-05-17 MED FILL — MINOCYCLINE 50 MG CAPSULE: 50 | 30 days supply | Qty: 60 | Fill #0

## 2016-05-21 MED FILL — DAYTRANA 20 MG/9 HOUR PATCH: 20 | 30 days supply | Qty: 30 | Fill #0

## 2016-05-24 ENCOUNTER — Ambulatory Visit (HOSPITAL_COMMUNITY): Payer: 59

## 2016-05-24 MED FILL — NORETHIN-ESTRAD-FERR 1-0.02: 1-20 | 84 days supply | Qty: 84 | Fill #2

## 2016-05-27 ENCOUNTER — Ambulatory Visit (HOSPITAL_COMMUNITY)
Admission: RE | Admit: 2016-05-27 | Discharge: 2016-05-27 | Disposition: A | Discharge status: Home / Self Care | Payer: 59 | Source: Ambulatory Visit | Attending: Family Medicine | Admitting: Family Medicine

## 2016-05-27 DIAGNOSIS — R1013 Epigastric pain: Secondary | ICD-10-CM | POA: Diagnosis not present

## 2016-05-27 DIAGNOSIS — K824 Cholesterolosis of gallbladder: Secondary | ICD-10-CM | POA: Diagnosis not present

## 2016-05-27 DIAGNOSIS — R932 Abnormal findings on diagnostic imaging of liver and biliary tract: Secondary | ICD-10-CM | POA: Diagnosis not present

## 2016-05-29 ENCOUNTER — Other Ambulatory Visit (HOSPITAL_COMMUNITY): Payer: Self-pay | Admitting: Family Medicine

## 2016-05-29 DIAGNOSIS — R932 Abnormal findings on diagnostic imaging of liver and biliary tract: Secondary | ICD-10-CM

## 2016-05-30 ENCOUNTER — Ambulatory Visit (HOSPITAL_COMMUNITY)
Admission: RE | Admit: 2016-05-30 | Discharge: 2016-05-30 | Disposition: A | Discharge status: Home / Self Care | Payer: 59 | Source: Ambulatory Visit | Attending: Family Medicine | Admitting: Family Medicine

## 2016-05-30 DIAGNOSIS — D134 Benign neoplasm of liver: Secondary | ICD-10-CM | POA: Insufficient documentation

## 2016-05-30 DIAGNOSIS — R932 Abnormal findings on diagnostic imaging of liver and biliary tract: Secondary | ICD-10-CM | POA: Insufficient documentation

## 2016-05-30 DIAGNOSIS — K7689 Other specified diseases of liver: Secondary | ICD-10-CM | POA: Diagnosis not present

## 2016-05-30 MED ORDER — GADOXETATE DISODIUM 0.25 MMOL/ML IV SOLN
7.0000 mL | Freq: Once | INTRAVENOUS | Status: AC | PRN
  Administered 2016-05-30: 7 mL via INTRAVENOUS

## 2016-06-03 NOTE — Progress Notes (Signed)
This encounter was created in error - please disregard.

## 2016-07-05 MED FILL — DAYTRANA 20 MG/9 HOUR PATCH: 20 | 30 days supply | Qty: 30 | Fill #0

## 2016-07-08 DIAGNOSIS — M545 Low back pain: Secondary | ICD-10-CM | POA: Diagnosis not present

## 2016-07-08 DIAGNOSIS — M9905 Segmental and somatic dysfunction of pelvic region: Secondary | ICD-10-CM | POA: Diagnosis not present

## 2016-07-08 DIAGNOSIS — M9903 Segmental and somatic dysfunction of lumbar region: Secondary | ICD-10-CM | POA: Diagnosis not present

## 2016-07-08 DIAGNOSIS — M9904 Segmental and somatic dysfunction of sacral region: Secondary | ICD-10-CM | POA: Diagnosis not present

## 2016-08-15 ENCOUNTER — Ambulatory Visit (HOSPITAL_COMMUNITY): Payer: Self-pay | Admitting: Psychiatry

## 2016-09-03 ENCOUNTER — Telehealth (HOSPITAL_COMMUNITY): Payer: Self-pay

## 2016-09-03 DIAGNOSIS — F900 Attention-deficit hyperactivity disorder, predominantly inattentive type: Secondary | ICD-10-CM

## 2016-09-03 NOTE — Telephone Encounter (Signed)
Patients mother is calling for a refill on Daytrana. Please review and advise, thank you

## 2016-09-05 MED ORDER — METHYLPHENIDATE 20 MG/9HR TD PTCH: 1.0000 | MEDICATED_PATCH | Freq: Every day | TRANSDERMAL | 0 refills | Status: DC

## 2016-09-05 NOTE — Telephone Encounter (Signed)
Yes we can refill 

## 2016-09-05 NOTE — Telephone Encounter (Signed)
Prescription printed and I called patients mother to let her know it was ready for pick up

## 2016-09-09 ENCOUNTER — Telehealth (HOSPITAL_COMMUNITY): Payer: Self-pay | Admitting: Psychiatry

## 2016-09-09 MED FILL — DAYTRANA 20 MG/9 HOUR PATCH: 20 | 30 days supply | Qty: 30 | Fill #0

## 2016-09-09 NOTE — Telephone Encounter (Signed)
Susan Bauer , concierge for mom Susan Bauer picked up prescription no license/dlo

## 2016-09-10 DIAGNOSIS — Z23 Encounter for immunization: Secondary | ICD-10-CM | POA: Diagnosis not present

## 2016-10-24 ENCOUNTER — Telehealth (HOSPITAL_COMMUNITY): Payer: Self-pay

## 2016-10-24 DIAGNOSIS — F900 Attention-deficit hyperactivity disorder, predominantly inattentive type: Secondary | ICD-10-CM

## 2016-10-24 MED ORDER — METHYLPHENIDATE 20 MG/9HR TD PTCH: 1.0000 | MEDICATED_PATCH | Freq: Every day | TRANSDERMAL | 0 refills | Status: DC

## 2016-10-24 NOTE — Telephone Encounter (Signed)
Yes

## 2016-10-24 NOTE — Telephone Encounter (Signed)
Patients mother is calling, patient has a follow up with you at the beginning of January, but will be out of her Daytrana before then. Patients mother would like to get a one month supply to last until follow up. Please review and advise, thank you

## 2016-10-25 MED FILL — DAYTRANA 20 MG/9 HOUR PATCH: 20 | 30 days supply | Qty: 30 | Fill #0

## 2016-10-25 MED FILL — MINOCYCLINE 50 MG CAPSULE: 50 | 30 days supply | Qty: 60 | Fill #0

## 2016-11-19 ENCOUNTER — Ambulatory Visit (HOSPITAL_COMMUNITY): Payer: Self-pay | Admitting: Psychiatry

## 2016-12-02 ENCOUNTER — Telehealth (HOSPITAL_COMMUNITY): Payer: Self-pay

## 2016-12-02 DIAGNOSIS — F900 Attention-deficit hyperactivity disorder, predominantly inattentive type: Secondary | ICD-10-CM

## 2016-12-02 NOTE — Telephone Encounter (Signed)
Patients mother is calling for a refill on Daytrana, their follow up is in March and she is requesting enough to get her to the appointment. Please review and advise, thank you

## 2016-12-03 NOTE — Telephone Encounter (Signed)
No refill. Pt was last seen in July 2017

## 2016-12-04 NOTE — Telephone Encounter (Signed)
Medication refill request - Called patient's Mother to inform patient's refill was not approved for Daytrana at this time due to patient had not been seen since July.  Collateral requested Dr.Agarwal reconsider as states patient missed one appointment in October but rescheduled for while she was home from school on 11/19/16 that Dr. Doyne Keel had to cancel when she was out.  Requests Dr. Doyne Keel fill order at least until patient returns 01/09/17 as states she is back at school now and really needs her medication.  Agreed to send message to Dr. Doyne Keel with request as she will be back in this office on 12/05/16.

## 2016-12-05 MED ORDER — METHYLPHENIDATE 20 MG/9HR TD PTCH: 1.0000 | MEDICATED_PATCH | Freq: Every day | TRANSDERMAL | 0 refills | Status: DC

## 2016-12-05 NOTE — Telephone Encounter (Signed)
Yes we can give one script

## 2016-12-10 ENCOUNTER — Telehealth (HOSPITAL_COMMUNITY): Payer: Self-pay | Admitting: Psychiatry

## 2016-12-10 MED FILL — DAYTRANA 20 MG/9 HOUR PATCH: 20 | 30 days supply | Qty: 30 | Fill #0

## 2016-12-10 NOTE — Telephone Encounter (Signed)
12/10/16 12:16pm Pt's mother [id number] Susan Bauer came and pick-up rx script.Marland KitchenMariana Kaufman

## 2017-01-09 ENCOUNTER — Encounter (HOSPITAL_COMMUNITY): Payer: Self-pay | Admitting: Psychiatry

## 2017-01-09 ENCOUNTER — Ambulatory Visit (INDEPENDENT_AMBULATORY_CARE_PROVIDER_SITE_OTHER): Payer: 59 | Admitting: Psychiatry

## 2017-01-09 VITALS — BP 142/80 | HR 114 | Ht 68.5 in | Wt 146.8 lb

## 2017-01-09 DIAGNOSIS — Z79899 Other long term (current) drug therapy: Secondary | ICD-10-CM | POA: Diagnosis not present

## 2017-01-09 DIAGNOSIS — F900 Attention-deficit hyperactivity disorder, predominantly inattentive type: Secondary | ICD-10-CM

## 2017-01-09 DIAGNOSIS — F4010 Social phobia, unspecified: Secondary | ICD-10-CM

## 2017-01-09 DIAGNOSIS — F332 Major depressive disorder, recurrent severe without psychotic features: Secondary | ICD-10-CM

## 2017-01-09 DIAGNOSIS — F411 Generalized anxiety disorder: Secondary | ICD-10-CM | POA: Diagnosis not present

## 2017-01-09 DIAGNOSIS — Z818 Family history of other mental and behavioral disorders: Secondary | ICD-10-CM

## 2017-01-09 MED ORDER — METHYLPHENIDATE 20 MG/9HR TD PTCH: 1.0000 | MEDICATED_PATCH | Freq: Every day | TRANSDERMAL | 0 refills | Status: DC

## 2017-01-09 MED ORDER — FLUOXETINE HCL 20 MG PO CAPS: 60.0000 mg | ORAL_CAPSULE | Freq: Every day | ORAL | 0 refills | Status: DC

## 2017-01-09 MED ORDER — ARIPIPRAZOLE 2 MG PO TABS: 2.0000 mg | ORAL_TABLET | Freq: Every day | ORAL | 0 refills | Status: DC

## 2017-01-09 NOTE — Progress Notes (Signed)
Patient ID: Susan Bauer, female   DOB: November 28, 1995, 21 y.o.   MRN: [id number]  [facility] [id number] Progress Note  Susan Bauer [id number] 20 y.o.  01/09/2017 4:04 PM  Chief Complaint: "I started college"  History of Present Illness: reviewed information below with patient on 01/09/17 and same as previous visits except as noted  Here with mother.   Pt started classes at [employer]. States it is very stressful.  Pt works 12 hrs about 3 days a week. It is tiring.  Pt began feeling depressed one day due to stress and reports it was the first time in over a year. It resolved after 24 hrs. Pt denies crying spells, anhedonia, isolation. States she is social. Denies SI/HI.  Anxiety is "fairly well controlled". Denies any issues with social anxiety. Pt is talking to a new guy. Pt is not taking Ativan.   Pt is using the [place] when she works or goes to class. She has mild SE of skin irritation. Pt puts it on at [place] and takes it off at 6:30pm. The effect wears off at 10pm and can sometimes affect her sleep.   Sleep is good and she is getting about 7-8 hrs/night.   Body image is still a little off but she is trying to accept herself as is. Pt is not restricting or purging.   Pt is looking for a new therapist as her old therapist retired.   Suicidal Ideation: No Plan Formed: No Patient has means to carry out plan: No  Homicidal Ideation: No Plan Formed: No Patient has means to carry out plan: No  Review of Systems: Psychiatric: Agitation: No Hallucination: No Depressed Mood: No Insomnia: No Hypersomnia: No Altered Concentration: No Feels Worthless: No Grandiose Ideas: No Belief In Special Powers: No New/Increased Substance Abuse: No Compulsions: No  Neurologic: Headache: No Seizure: No Paresthesias: No  Review of Systems  Constitutional: Negative for chills, fever and weight loss.  HENT: Negative for congestion, ear pain, hearing loss, nosebleeds, sinus pain  and sore throat.   Neurological: Negative for dizziness, tremors, sensory change, seizures, loss of consciousness, weakness and headaches.  Psychiatric/Behavioral: Negative for depression, hallucinations, substance abuse and suicidal ideas. The patient is not nervous/anxious and does not have insomnia.      Past Medical Family, Social History: lives with mom. Works at [employer]   reports that she has never smoked. She has never used smokeless tobacco. She reports that she does not drink alcohol or use drugs.  Family History  Problem Relation Age of Onset  . Depression Mother    Past Medical History:  Diagnosis Date  . ADHD (attention deficit hyperactivity disorder)   . Anxiety   . Depression   . Oppositional defiant disorder   . Wears glasses     Outpatient Encounter Prescriptions as of 01/09/2017  Medication Sig  . ARIPiprazole (ABILIFY) 2 MG tablet Take 1 tablet (2 mg total) by mouth daily.  . Calcium Carbonate-Vitamin D (CALCIUM 600+D HIGH POTENCY) 600-400 MG-UNIT per tablet Take 1 tablet by mouth 2 (two) times daily.  . Clindamycin Phos-Benzoyl Perox (ONEXTON) 1.2-3.75 % GEL Apply topically.  . ferrous sulfate 325 (65 FE) MG tablet Take 325 mg by mouth daily with breakfast. Reported on 02/13/2016  . FLUoxetine (PROZAC) 20 MG capsule Take 3 capsules (60 mg total) by mouth daily.  . methylphenidate (DAYTRANA) 20 MG/9HR Place 1 patch onto the skin daily. wear patch for 9 hours only each day  .  MINOCYCLINE HCL PO Take by mouth.  . Multiple Vitamin (MULITIVITAMIN WITH MINERALS) TABS Take 1 tablet by mouth daily.  . norethindrone-ethinyl estradiol (JUNEL FE,GILDESS FE,LOESTRIN FE) 1-20 MG-MCG tablet Take 1 tablet by mouth daily.   No facility-administered encounter medications on file as of 01/09/2017.     Past Psychiatric History/Hospitalization(s): Anxiety: Yes Bipolar Disorder: No Depression: Yes Mania: No Psychosis: No Schizophrenia: No Personality Disorder:  No Hospitalization for psychiatric illness: No History of Electroconvulsive Shock Therapy: No Prior Suicide Attempts: Yes  Physical Exam: Constitutional:  BP (!) 142/80   Pulse (!) 114   Ht 5' 8.5" (1.74 m)   Wt 146 lb 12.8 oz (66.6 kg)   BMI 22.00 kg/m   General Appearance: alert, oriented, no acute distress and well nourished  Musculoskeletal: Strength & Muscle Tone: within normal limits Gait & Station: normal Patient leans: straight  Mental Status Examination/Evaluation: reviewed MSE on 01/09/17 and same as previous visits except as noted  Objective: Attitude: Calm and cooperative  Appearance: Fairly Groomed, appears to be stated age  Engineer, water::  Fair  Speech:  Clear and Coherent and Normal Rate  Volume:  Normal  Mood:  euthymic  Affect:  Appropriate  Thought Process:  Goal Directed, Linear and Logical  Orientation:  Full (Time, Place, and Person)  Thought Content:  Negative  Suicidal Thoughts:  No  Homicidal Thoughts:  No  Judgement:  Fair  Insight:  Fair  Concentration: good  Memory: Immediate-intact Recent-intact Remote-intact  Recall: fair  Language: fair  Gait and Station: normal  [employer] of Knowledge: average  Psychomotor Activity:  Normal  Akathisia:  No  Handed:  Right  AIMS (if indicated):  Facial and Oral Movements  Muscles of Facial Expression: None, normal  Lips and Perioral Area: None, normal  Jaw: None, normal  Tongue: None, normal Extremity Movements: Upper (arms, wrists, hands, fingers): None, normal  Lower (legs, knees, ankles, toes): None, normal,  Trunk Movements:  Neck, shoulders, hips: None, normal,  Overall Severity : Severity of abnormal movements (highest score from questions above): None, normal  Incapacitation due to abnormal movements: None, normal  Patient's awareness of abnormal movements (rate only patient's report): No Awareness, Dental Status  Current problems with teeth and/or dentures?: No  Does patient  usually wear dentures?: No    Assets:  Communication Skills Desire for Improvement Financial Resources/Insurance Housing Leisure Time Physical Health Resilience Social Support Talents/Skills       Reviewed A&P on 01/09/17 and same as previous visits except as noted  Assessment: Axis I: MDD- recurrent, severe without psychotic features, GAD, Social anxiety disorder, ADHD- inattentive type  Axis II: deferred    Plan:   Abilify 2mg  po qD for adjunct treatment of mood Prozac 60mg  po for depression  Daytrana Patch 20mg  po qD for concentration Ativan 0.5mg  po qD prn anxiety  Medication management with supportive therapy. Risks/benefits and SE of the medication discussed. Pt verbalized understanding and verbal consent obtained for treatment.  Affirm with the patient that the medications are taken as ordered. Patient expressed understanding of how their medications were to be used.  -improvement of symptoms  Labs- reviewed labs from PCP 07/08/2014  CBC WNL, CMP WNL, TSH WNL, Prolactic WNL, reviewed EKG 03/10/2015 QTc 441  Labs ordered- CBC, CMP, HbA1c, Lipid panel, TSH, Prolactin level, EKG   Therapy: brief supportive therapy provided. Discussed psychosocial stressors in detail.   Encouraged to continue individual therapy   Pt denies SI and is at an acute low  risk for suicide.Patient told to call clinic if any problems occur. Patient advised to go to ER if they should develop SI/HI, side effects, or if symptoms worsen. Has crisis numbers to call if needed. Pt verbalized understanding.  F/up in 3 months or sooner if needed  Charlcie Cradle, MD 01/09/2017

## 2017-01-09 NOTE — Patient Instructions (Signed)
1. Labs 2. EKG call [phone]  

## 2017-01-10 MED FILL — ARIPiprazole 2 MG TABS: 2 | 90 days supply | Qty: 90 | Fill #0

## 2017-01-10 MED FILL — FLUoxetine HCL 20 MG CAPS: 20 | 90 days supply | Qty: 270 | Fill #0

## 2017-01-14 MED FILL — DAYTRANA 20 MG/9 HOUR PATCH: 20 | 30 days supply | Qty: 30 | Fill #0

## 2017-01-20 ENCOUNTER — Other Ambulatory Visit (HOSPITAL_COMMUNITY): Payer: Self-pay

## 2017-01-23 ENCOUNTER — Other Ambulatory Visit (HOSPITAL_COMMUNITY): Payer: Self-pay

## 2017-01-27 ENCOUNTER — Other Ambulatory Visit (HOSPITAL_COMMUNITY): Payer: Self-pay

## 2017-03-03 DIAGNOSIS — Z01419 Encounter for gynecological examination (general) (routine) without abnormal findings: Secondary | ICD-10-CM | POA: Diagnosis not present

## 2017-03-03 DIAGNOSIS — Z6822 Body mass index (BMI) 22.0-22.9, adult: Secondary | ICD-10-CM | POA: Diagnosis not present

## 2017-03-03 MED FILL — NORETHIN-ESTRAD-FERR 1-0.02: 1-20 | 84 days supply | Qty: 84 | Fill #0

## 2017-03-06 MED FILL — DAYTRANA 20 MG/9 HOUR PATCH: 20 | 30 days supply | Qty: 30 | Fill #0

## 2017-03-10 DIAGNOSIS — F322 Major depressive disorder, single episode, severe without psychotic features: Secondary | ICD-10-CM | POA: Diagnosis not present

## 2017-03-10 DIAGNOSIS — Z79899 Other long term (current) drug therapy: Secondary | ICD-10-CM | POA: Diagnosis not present

## 2017-03-10 DIAGNOSIS — Z0000 Encounter for general adult medical examination without abnormal findings: Secondary | ICD-10-CM | POA: Diagnosis not present

## 2017-03-27 MED FILL — MINOCYCLINE 50 MG CAPSULE: 50 | 30 days supply | Qty: 60 | Fill #0

## 2017-04-17 ENCOUNTER — Ambulatory Visit (INDEPENDENT_AMBULATORY_CARE_PROVIDER_SITE_OTHER): Payer: 59 | Admitting: Psychiatry

## 2017-04-17 ENCOUNTER — Encounter (HOSPITAL_COMMUNITY): Payer: Self-pay | Admitting: Psychiatry

## 2017-04-17 DIAGNOSIS — Z818 Family history of other mental and behavioral disorders: Secondary | ICD-10-CM

## 2017-04-17 DIAGNOSIS — F332 Major depressive disorder, recurrent severe without psychotic features: Secondary | ICD-10-CM

## 2017-04-17 DIAGNOSIS — F411 Generalized anxiety disorder: Secondary | ICD-10-CM

## 2017-04-17 DIAGNOSIS — F4010 Social phobia, unspecified: Secondary | ICD-10-CM

## 2017-04-17 DIAGNOSIS — F900 Attention-deficit hyperactivity disorder, predominantly inattentive type: Secondary | ICD-10-CM | POA: Diagnosis not present

## 2017-04-17 MED ORDER — FLUOXETINE HCL 20 MG PO CAPS: 60.0000 mg | ORAL_CAPSULE | Freq: Every day | ORAL | 0 refills | Status: DC

## 2017-04-17 MED ORDER — METHYLPHENIDATE 20 MG/9HR TD PTCH: 1.0000 | MEDICATED_PATCH | Freq: Every day | TRANSDERMAL | 0 refills | Status: DC

## 2017-04-17 MED ORDER — ARIPIPRAZOLE 2 MG PO TABS: 2.0000 mg | ORAL_TABLET | Freq: Every day | ORAL | 0 refills | Status: DC

## 2017-04-17 NOTE — Progress Notes (Signed)
BH MD/PA/NP OP Progress Note  04/17/2017 1:43 PM WRENLY LAURITSEN  MRN:  [id number]  Chief Complaint:  Chief Complaint    Follow-up      HPI: Here with mother.  Pt states she is doing well. She has been exercising 30 min/day.  States socially she is doing well. She has made a lot of plans for the summer- beach, camping and lake house in [place]. She has been going out with friends.  Pt denies depression. Denies anhedonia, isolation, crying spells, low motivation, poor hygiene, worthlessness and hopelessness. Denies SI/HI.  Sleep is good. She is getting 6-7 hrs/night. Appetite, energy and concentration are good.  Pt wants to get a new job. She is going to apply to [employer] in she spring. She states online classes did not go well.   Anxiety is present but is not as bad as before. It does prevent her from doing some things like applying to [employer]. Pt is not taking Ativan.   Taking meds as prescribed and denies SE.   Mom states pt is doing well and she has no concerns.   Visit Diagnosis:    ICD-10-CM   1. Attention deficit hyperactivity disorder (ADHD), predominantly inattentive type F90.0 methylphenidate (DAYTRANA) 20 MG/9HR    methylphenidate (DAYTRANA) 20 MG/9HR    methylphenidate (DAYTRANA) 20 MG/9HR  2. Major depressive disorder, recurrent, severe without psychotic features ([place]) F33.2 FLUoxetine (PROZAC) 20 MG capsule    ARIPiprazole (ABILIFY) 2 MG tablet  3. Social anxiety disorder F40.10 FLUoxetine (PROZAC) 20 MG capsule  4. GAD (generalized anxiety disorder) F41.1 FLUoxetine (PROZAC) 20 MG capsule      Past Psychiatric History:  Anxiety: Yes Bipolar Disorder: No Depression: Yes Mania: No Psychosis: No Schizophrenia: No Personality Disorder: No Hospitalization for psychiatric illness: No History of Electroconvulsive Shock Therapy: No Prior Suicide Attempts: Yes   Past Medical History:  Past Medical History:  Diagnosis Date  . ADHD (attention deficit hyperactivity  disorder)   . Anxiety   . Depression   . Oppositional defiant disorder   . Wears glasses     Past Surgical History:  Procedure Laterality Date  . WISDOM TOOTH EXTRACTION      Family Psychiatric History: Family History  Problem Relation Age of Onset  . Depression Mother     Social History:  Social History   Social History  . Marital status: Single    Spouse name: N/A  . Number of children: N/A  . Years of education: N/A   Social History Main Topics  . Smoking status: Never Smoker  . Smokeless tobacco: Never Used  . Alcohol use Yes     Comment: Occasional beer  . Drug use: No  . Sexual activity: No   Other Topics Concern  . None   Social History Narrative  . None    Allergies:  Allergies  Allergen Reactions  . Ritalin La [Methylphenidate Hcl Er (La)] Other (See Comments)    Tachycardia   . Sulfa Antibiotics     Metabolic Disorder Labs: No results found for: HGBA1C, MPG No results found for: PROLACTIN No results found for: CHOL, TRIG, HDL, CHOLHDL, VLDL, LDLCALC   Current Medications: Current Outpatient Prescriptions  Medication Sig Dispense Refill  . ARIPiprazole (ABILIFY) 2 MG tablet Take 1 tablet (2 mg total) by mouth daily. 90 tablet 0  . Calcium Carbonate-Vitamin D (CALCIUM 600+D HIGH POTENCY) 600-400 MG-UNIT per tablet Take 1 tablet by mouth 2 (two) times daily.    . Clindamycin Phos-Benzoyl Perox (ONEXTON)  1.2-3.75 % GEL Apply topically.    . ferrous sulfate 325 (65 FE) MG tablet Take 325 mg by mouth daily with breakfast. Reported on 02/13/2016    . FLUoxetine (PROZAC) 20 MG capsule Take 3 capsules (60 mg total) by mouth daily. 270 capsule 0  . LORazepam (ATIVAN) 0.5 MG tablet Take 0.5 mg by mouth daily as needed for anxiety.    . methylphenidate (DAYTRANA) 20 MG/9HR Place 1 patch onto the skin daily. wear patch for 9 hours only each day 30 patch 0  . methylphenidate (DAYTRANA) 20 MG/9HR Place 1 patch onto the skin daily. wear patch for 9 hours only  each day 30 patch 0  . methylphenidate (DAYTRANA) 20 MG/9HR Place 1 patch onto the skin daily. wear patch for 9 hours only each day 30 patch 0  . MINOCYCLINE HCL PO Take by mouth.    . Multiple Vitamin (MULITIVITAMIN WITH MINERALS) TABS Take 1 tablet by mouth daily.    . norethindrone-ethinyl estradiol (JUNEL FE,GILDESS FE,LOESTRIN FE) 1-20 MG-MCG tablet Take 1 tablet by mouth daily.     No current facility-administered medications for this visit.      Musculoskeletal: Strength & Muscle Tone: within normal limits Gait & Station: normal Patient leans: N/A  Psychiatric Specialty Exam: Review of Systems  HENT: Negative for congestion, nosebleeds and sinus pain.   Neurological: Negative for dizziness, tingling and headaches.  Psychiatric/Behavioral: Negative for depression, hallucinations, substance abuse and suicidal ideas. The patient is not nervous/anxious and does not have insomnia.     Blood pressure 108/66, pulse 66, height 5\' 8"  (1.727 m), weight 160 lb (72.6 kg), SpO2 99 %.Body mass index is 24.33 kg/m.  General Appearance: Fairly Groomed  Eye Contact:  Good  Speech:  Clear and Coherent and Normal Rate  Volume:  Normal  Mood:  Euthymic  Affect:  Appropriate and Full Range  Thought Process:  Goal Directed and Descriptions of Associations: Intact  Orientation:  Full (Time, Place, and Person)  Thought Content: Logical   Suicidal Thoughts:  No  Homicidal Thoughts:  No  Memory:  Immediate;   Good Recent;   Good Remote;   Good  Judgement:  Good  Insight:  Good  Psychomotor Activity:  Normal  Concentration:  Concentration: Good and Attention Span: Good  Recall:  Good  Fund of Knowledge: Good  Language: Good  Akathisia:  No  Handed:  Right  AIMS (if indicated):  n/a  Assets:  Communication Skills Desire for Improvement Housing Leisure Time Physical Health Resilience Social Support Talents/Skills Transportation  ADL's:  Intact  Cognition: WNL  Sleep:  good      Treatment Plan Summary:Medication management   Assessment: MDD-recurrent,mild; GAD; Social anxiety disorder; ADHD-inattentive type   Medication management with supportive therapy. Risks/benefits and SE of the medication discussed. Pt verbalized understanding and verbal consent obtained for treatment.  Affirm with the patient that the medications are taken as ordered. Patient expressed understanding of how their medications were to be used.   Meds: Abilify 2mg  po qD for adjunct for mood Prozac 60mg  po qD for depression and anxiety Dayatrana Patch 20mg  p qD for ADHD D/c Ativan Pt has been taking above medications without SE or AR  Labs:none    Therapy: brief supportive therapy provided. Discussed psychosocial stressors in detail.     Consultations:  None  Pt denies SI and is at an acute low risk for suicide. Patient told to call clinic if any problems occur. Patient advised to go to ER  if they should develop SI/HI, side effects, or if symptoms worsen. Has crisis numbers to call if needed. Pt verbalized understanding.  F/up in 3 months or sooner if needed    Charlcie Cradle, MD 04/17/2017, 1:43 PM

## 2017-05-12 MED FILL — DAYTRANA 20 MG/9 HOUR PATCH: 20 | 30 days supply | Qty: 30 | Fill #0

## 2017-05-26 DIAGNOSIS — H5213 Myopia, bilateral: Secondary | ICD-10-CM | POA: Diagnosis not present

## 2017-06-30 MED FILL — NORETHIN-ESTRAD-FERR 1-0.02: 1-20 | 84 days supply | Qty: 84 | Fill #1

## 2017-07-08 MED FILL — DAYTRANA 20 MG/9 HOUR PATCH: 20 | 30 days supply | Qty: 30 | Fill #0

## 2017-07-24 ENCOUNTER — Encounter (HOSPITAL_COMMUNITY): Payer: Self-pay | Admitting: Psychiatry

## 2017-07-24 ENCOUNTER — Other Ambulatory Visit (HOSPITAL_COMMUNITY): Payer: Self-pay

## 2017-07-24 ENCOUNTER — Ambulatory Visit (INDEPENDENT_AMBULATORY_CARE_PROVIDER_SITE_OTHER): Payer: 59 | Admitting: Psychiatry

## 2017-07-24 DIAGNOSIS — R450 Nervousness: Secondary | ICD-10-CM | POA: Diagnosis not present

## 2017-07-24 DIAGNOSIS — M545 Low back pain: Secondary | ICD-10-CM | POA: Diagnosis not present

## 2017-07-24 DIAGNOSIS — F332 Major depressive disorder, recurrent severe without psychotic features: Secondary | ICD-10-CM | POA: Diagnosis not present

## 2017-07-24 DIAGNOSIS — F4010 Social phobia, unspecified: Secondary | ICD-10-CM | POA: Diagnosis not present

## 2017-07-24 DIAGNOSIS — G4700 Insomnia, unspecified: Secondary | ICD-10-CM | POA: Diagnosis not present

## 2017-07-24 DIAGNOSIS — F411 Generalized anxiety disorder: Secondary | ICD-10-CM | POA: Diagnosis not present

## 2017-07-24 DIAGNOSIS — F900 Attention-deficit hyperactivity disorder, predominantly inattentive type: Secondary | ICD-10-CM

## 2017-07-24 DIAGNOSIS — Z818 Family history of other mental and behavioral disorders: Secondary | ICD-10-CM

## 2017-07-24 DIAGNOSIS — M542 Cervicalgia: Secondary | ICD-10-CM | POA: Diagnosis not present

## 2017-07-24 DIAGNOSIS — M9902 Segmental and somatic dysfunction of thoracic region: Secondary | ICD-10-CM | POA: Diagnosis not present

## 2017-07-24 DIAGNOSIS — M9901 Segmental and somatic dysfunction of cervical region: Secondary | ICD-10-CM | POA: Diagnosis not present

## 2017-07-24 MED ORDER — ARIPIPRAZOLE 2 MG PO TABS: 2.0000 mg | ORAL_TABLET | Freq: Every day | ORAL | 0 refills | Status: DC

## 2017-07-24 MED ORDER — METHYLPHENIDATE 20 MG/9HR TD PTCH: 1.0000 | MEDICATED_PATCH | Freq: Every day | TRANSDERMAL | 0 refills | Status: DC

## 2017-07-24 MED ORDER — FLUOXETINE HCL 20 MG PO CAPS: 60.0000 mg | ORAL_CAPSULE | Freq: Every day | ORAL | 0 refills | Status: DC

## 2017-07-24 NOTE — Progress Notes (Signed)
[place] MD/PA/NP OP Progress Note  07/24/2017 3:23 PM Susan Bauer  MRN:  [id number]  Chief Complaint:  Chief Complaint    Follow-up; Medication Refill     HPI: Pt states work has been more stressful lately. She has had a increase in hours and responsibilities. She recently broken up with her boyfriend about 3 weeks. This has caused some depression. She has withdrawn and is isolating. Pt goes to work and then comes home and sleeps. She is trying to socializing but doesn't want to. She does enjoy it when she goes out. Pt has her 1st therapy appointment next week. Pt's appetite goes down when stressed and she is only eating 2 small meals/day. Pt states sleep is poor. Her motivation and energy are low. Pt is playing with cat or plays a video game. Pt denies worthlessness, hopelessness. Pt denies SI/HI.  Anxiety is "off of the charts". She is making minor mistakes at work and it stressed it her out more.  Taking meds as prescribed and denies SE. Meds are helping. She states she is slowly getting better and expects to continue to do so.  Visit Diagnosis:    ICD-10-CM   1. 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 Social anxiety disorder F40.10 FLUoxetine (PROZAC) 20 MG capsule  3. GAD (generalized anxiety disorder) F41.1 FLUoxetine (PROZAC) 20 MG capsule  4. Attention deficit hyperactivity disorder (ADHD), predominantly inattentive type F90.0 methylphenidate (DAYTRANA) 20 MG/9HR    methylphenidate (DAYTRANA) 20 MG/9HR    methylphenidate (DAYTRANA) 20 MG/9HR    Past Psychiatric History:  Anxiety:Yes Bipolar Disorder:No Depression:Yes Mania:No Psychosis:No Schizophrenia:No Personality Disorder:No Hospitalization for psychiatric illness:No History of Electroconvulsive Shock Therapy:No Prior Suicide Attempts:Yes  Past Medical History:  Past Medical History:   Diagnosis Date  . ADHD (attention deficit hyperactivity disorder)   . Anxiety   . Depression   . Oppositional defiant disorder   . Wears glasses     Past Surgical History:  Procedure Laterality Date  . WISDOM TOOTH EXTRACTION      Family Psychiatric History: Family History  Problem Relation Age of Onset  . Depression Mother     Social History:  Social History   Social History  . Marital status: Single    Spouse name: N/A  . Number of children: N/A  . Years of education: N/A   Social History Main Topics  . Smoking status: Never Smoker  . Smokeless tobacco: Never Used  . Alcohol use Yes     Comment: Occasional beer  . Drug use: No  . Sexual activity: No   Other Topics Concern  . None   Social History Narrative  . None    Allergies:  Allergies  Allergen Reactions  . Ritalin La [Methylphenidate Hcl Er (La)] Other (See Comments)    Tachycardia   . Sulfa Antibiotics     Metabolic Disorder Labs: No results found for: HGBA1C, MPG No results found for: PROLACTIN No results found for: CHOL, TRIG, HDL, CHOLHDL, VLDL, LDLCALC No results found for: TSH  Therapeutic Level Labs: No results found for: LITHIUM No results found for: VALPROATE No components found for:  CBMZ  Current Medications: Current Outpatient Prescriptions  Medication Sig Dispense Refill  . ARIPiprazole (ABILIFY) 2 MG tablet Take 1 tablet (2 mg total) by mouth daily. 90 tablet 0  . Clindamycin Phos-Benzoyl Perox (ONEXTON) 1.2-3.75 % GEL Apply topically.    Marland Kitchen  FLUoxetine (PROZAC) 20 MG capsule Take 3 capsules (60 mg total) by mouth daily. 270 capsule 0  . methylphenidate (DAYTRANA) 20 MG/9HR Place 1 patch onto the skin daily. wear patch for 9 hours only each day 30 patch 0  . methylphenidate (DAYTRANA) 20 MG/9HR Place 1 patch onto the skin daily. wear patch for 9 hours only each day 30 patch 0  . methylphenidate (DAYTRANA) 20 MG/9HR Place 1 patch onto the skin daily. wear patch for 9 hours only  each day 30 patch 0  . MINOCYCLINE HCL PO Take by mouth.    . norethindrone-ethinyl estradiol (JUNEL FE,GILDESS FE,LOESTRIN FE) 1-20 MG-MCG tablet Take 1 tablet by mouth daily.     No current facility-administered medications for this visit.      Musculoskeletal: Strength & Muscle Tone: within normal limits Gait & Station: normal Patient leans: N/A  Psychiatric Specialty Exam: Review of Systems  Gastrointestinal: Negative for abdominal pain, constipation, diarrhea and vomiting.  Psychiatric/Behavioral: Positive for depression. Negative for hallucinations, substance abuse and suicidal ideas. The patient is nervous/anxious and has insomnia.     Blood pressure 138/74, pulse 77, height 5' 8.5" (1.74 m), weight 147 lb 6.4 oz (66.9 kg).Body mass index is 22.09 kg/m.  General Appearance: Casual  Eye Contact:  Good  Speech:  Clear and Coherent and Normal Rate  Volume:  Normal  Mood:  Anxious and Depressed  Affect:  Congruent  Thought Process:  Goal Directed and Descriptions of Associations: Intact  Orientation:  Full (Time, Place, and Person)  Thought Content: WDL   Suicidal Thoughts:  No  Homicidal Thoughts:  No  Memory:  Immediate;   Good Recent;   Good Remote;   Good  Judgement:  Good  Insight:  Good  Psychomotor Activity:  Normal  Concentration:  Concentration: Good and Attention Span: Good  Recall:  Good  Fund of Knowledge: Good  Language: Good  Akathisia:  No  Handed:  Right  AIMS (if indicated): not done  Assets:  Communication Skills Desire for Improvement Financial Resources/Insurance Housing Social Support Talents/Skills Transportation Vocational/Educational  ADL's:  Intact  Cognition: WNL  Sleep:  Poor   Screenings:   Assessment and Plan:  MDD-recurrent, mild; GAD; Social anxiety disorder; ADHD-inattentive type   Medication management with supportive therapy. Risks/benefits and SE of the medication discussed. Pt verbalized understanding and verbal  consent obtained for treatment.  Affirm with the patient that the medications are taken as ordered. Patient expressed understanding of how their medications were to be used.    Meds:Abilify 2mg  po qD for adjunct for mood Prozac 60mg  po qD for depression and anxiety Dayatrana Patch 20mg  p qD for ADHD Start Melatonin 3mg  po qHS prn insomnia Pt has been taking above medications without SE or AR   Labs: reviewed labs from 03/10/17 CBC, CMP, HbA1c, Lipid panel, TSH, Prolactin level WNL   Therapy: brief supportive therapy provided. Discussed psychosocial stressors in detail.    Consultations: none  Pt denies SI and is at an acute low risk for suicide. Patient told to call clinic if any problems occur. Patient advised to go to ER if they should develop SI/HI, side effects, or if symptoms worsen. Has crisis numbers to call if needed. Pt verbalized understanding.  F/up in 2 months or sooner if needed    Charlcie Cradle, MD 07/24/2017, 3:23 PM

## 2017-07-30 DIAGNOSIS — M9902 Segmental and somatic dysfunction of thoracic region: Secondary | ICD-10-CM | POA: Diagnosis not present

## 2017-07-30 DIAGNOSIS — M545 Low back pain: Secondary | ICD-10-CM | POA: Diagnosis not present

## 2017-07-30 DIAGNOSIS — M9901 Segmental and somatic dysfunction of cervical region: Secondary | ICD-10-CM | POA: Diagnosis not present

## 2017-07-30 DIAGNOSIS — M542 Cervicalgia: Secondary | ICD-10-CM | POA: Diagnosis not present

## 2017-08-13 MED FILL — DAYTRANA 20 MG/9 HOUR PATCH: 20 | 30 days supply | Qty: 30 | Fill #0

## 2017-08-13 MED FILL — MINOCYCLINE 50 MG CAPSULE: 50 | 30 days supply | Qty: 60 | Fill #1

## 2017-08-21 DIAGNOSIS — M9902 Segmental and somatic dysfunction of thoracic region: Secondary | ICD-10-CM | POA: Diagnosis not present

## 2017-08-21 DIAGNOSIS — M545 Low back pain: Secondary | ICD-10-CM | POA: Diagnosis not present

## 2017-08-21 DIAGNOSIS — M9901 Segmental and somatic dysfunction of cervical region: Secondary | ICD-10-CM | POA: Diagnosis not present

## 2017-08-21 DIAGNOSIS — M542 Cervicalgia: Secondary | ICD-10-CM | POA: Diagnosis not present

## 2017-09-18 MED FILL — DAYTRANA 20 MG/9 HOUR PATCH: 20 | 30 days supply | Qty: 30 | Fill #0

## 2017-09-22 DIAGNOSIS — L728 Other follicular cysts of the skin and subcutaneous tissue: Secondary | ICD-10-CM | POA: Diagnosis not present

## 2017-09-22 DIAGNOSIS — L709 Acne, unspecified: Secondary | ICD-10-CM | POA: Diagnosis not present

## 2017-09-25 ENCOUNTER — Ambulatory Visit (HOSPITAL_COMMUNITY): Payer: Self-pay | Admitting: Psychiatry

## 2017-09-29 MED FILL — DAPSONE 5% GEL: 5 | 30 days supply | Qty: 90 | Fill #0

## 2017-09-29 MED FILL — MINOCYCLINE 50 MG CAPSULE: 50 | 30 days supply | Qty: 30 | Fill #0

## 2017-12-09 ENCOUNTER — Other Ambulatory Visit: Payer: Self-pay | Admitting: Family Medicine

## 2017-12-09 DIAGNOSIS — R102 Pelvic and perineal pain: Secondary | ICD-10-CM

## 2017-12-12 ENCOUNTER — Ambulatory Visit
Admission: RE | Admit: 2017-12-12 | Discharge: 2017-12-12 | Disposition: A | Discharge status: Home / Self Care | Payer: Self-pay | Source: Ambulatory Visit | Attending: Family Medicine | Admitting: Family Medicine

## 2017-12-12 ENCOUNTER — Other Ambulatory Visit: Payer: Self-pay | Admitting: Family Medicine

## 2017-12-12 DIAGNOSIS — R102 Pelvic and perineal pain: Secondary | ICD-10-CM

## 2017-12-24 MED FILL — FLUoxetine HCL 20 MG CAPS: 20 | 90 days supply | Qty: 270 | Fill #0

## 2018-02-10 MED FILL — SM CLEARLAX POWDER: 13 days supply | Qty: 238 | Fill #0

## 2018-05-10 IMAGING — MR MR ABDOMEN WO/W CM
9 of 19 series · 20 of 48 positions shown · IV contrast (eovist)
Comparison: Abdominal ultrasound dated 05/27/2016

CLINICAL DATA: Acute epigastric pain, abnormal liver ultrasound

EXAM:
MRI ABDOMEN WITHOUT AND WITH CONTRAST
TECHNIQUE: Multiplanar multisequence MR imaging of the abdomen was performed
both before and after the administration of intravenous contrast.
CONTRAST:  7 mL Eovist IV

[Series 3: T2 · coronal · 5.0mm · 0.78mm/px · 1 of 37 slices shown (1 of 2)]
[im 1/37]
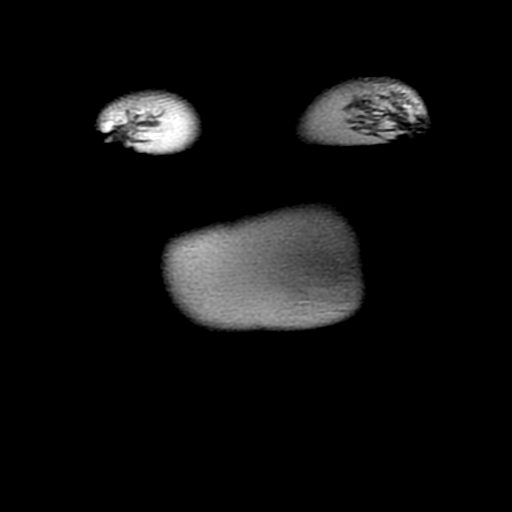

[Series 4: ax dualecho · axial · 5.0mm · 0.78mm/px · z∈[-87,+146]mm · 3 of 96 slices shown]
[im 1/96]
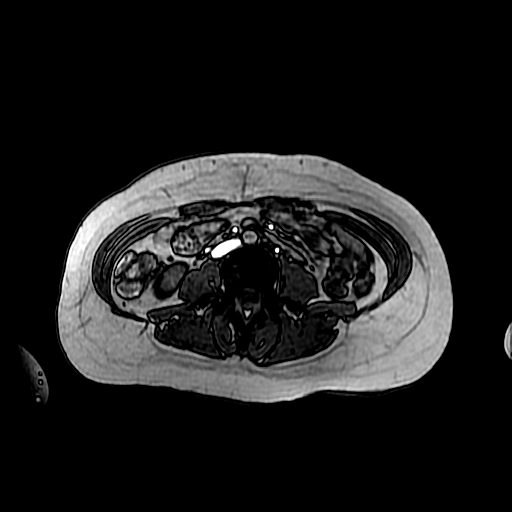
[im 48/96]
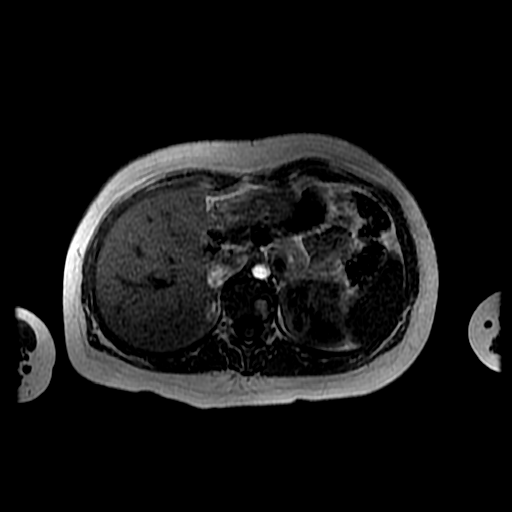
[im 96/96]
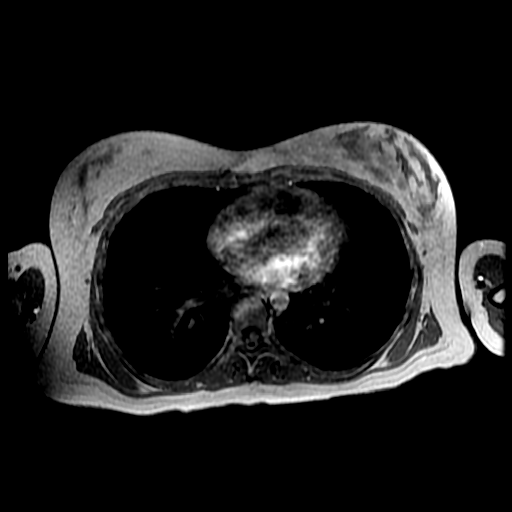

[Series 8: T2 fat-sat · axial · 5.0mm · 0.78mm/px · z∈[-88,+152]mm · 2 of 49 slices shown]
[im 1/49]
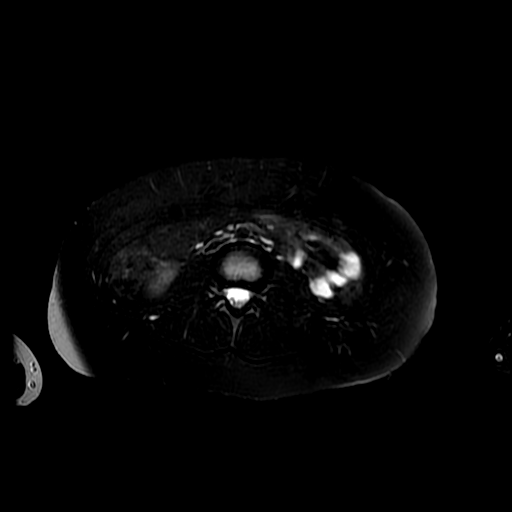
[im 49/49]
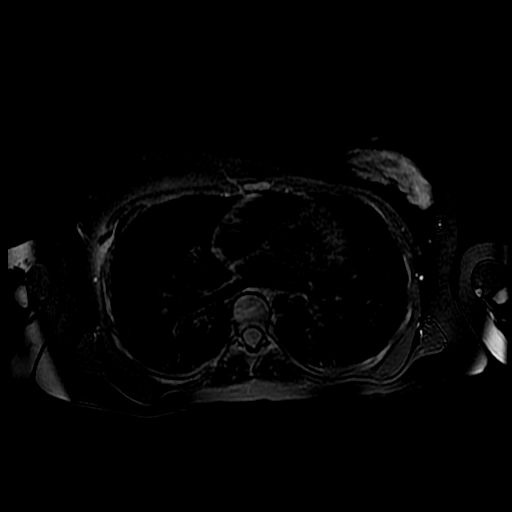

[Series 9: DWI b500 · axial · 6.0mm · 1.48mm/px · z∈[-99,+157]mm · 2 of 68 slices shown]
[im 1/68]
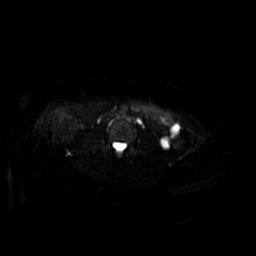
[im 68/68]
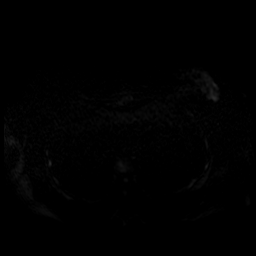

[Series 10: T2 · axial · 5.0mm · 0.78mm/px · z∈[-87,+146]mm · 2 of 48 slices shown (2 of 2)]
[im 1/48]
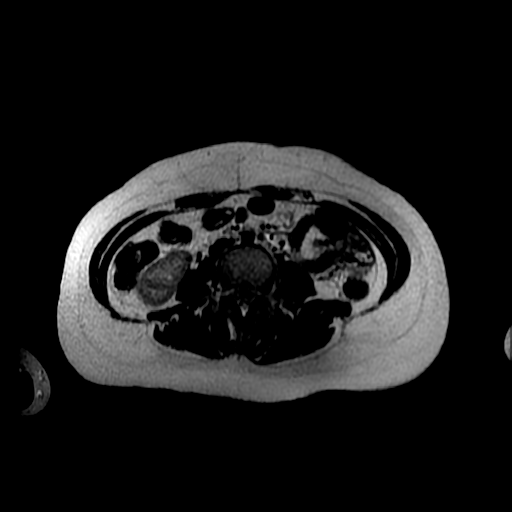
[im 48/48]
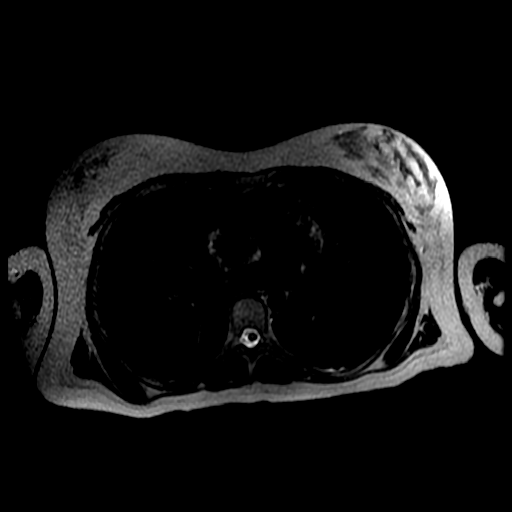

[Series 11: bSSFP fat-sat · axial · 5.0mm · 0.78mm/px · z∈[-86,+158]mm · 2 of 50 slices shown]
[im 1/50]
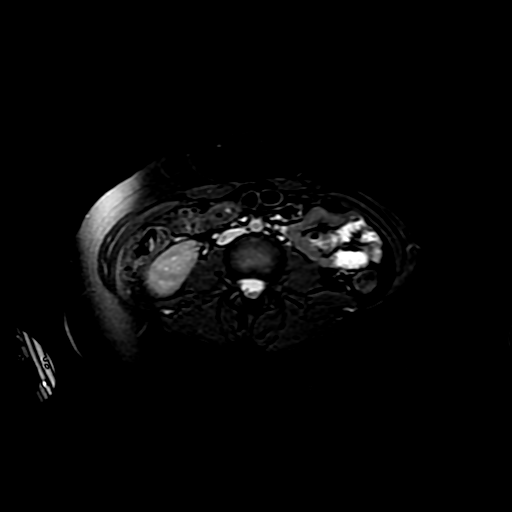
[im 50/50]
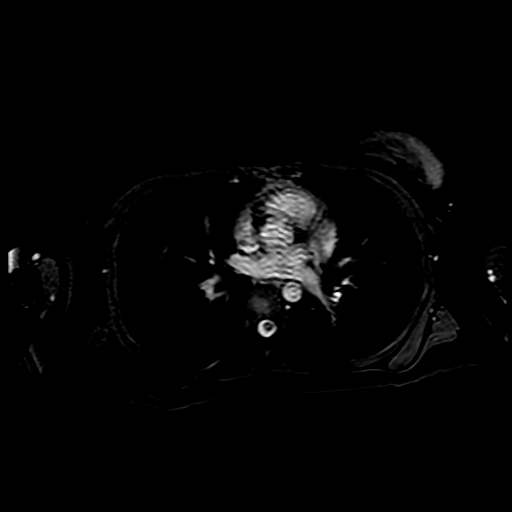

[Series 13: T1 dynamic · coronal · delayed · 6.0mm · 0.80mm/px · 2 of 64 slices shown (1 of 2)]
[im 1/64]
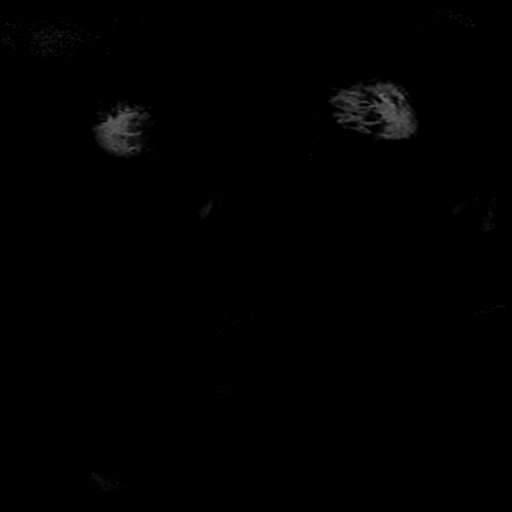
[im 64/64]
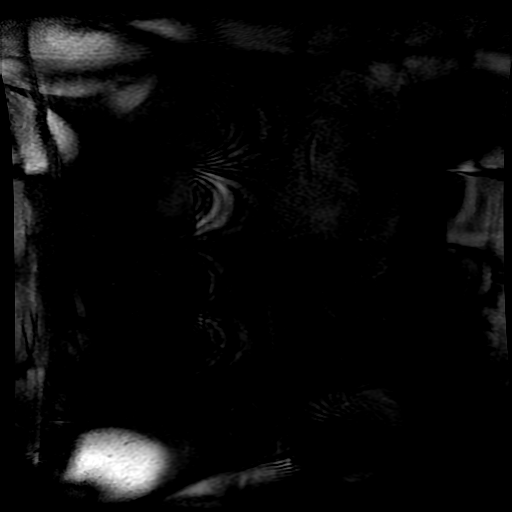

[Series 14: T1 dynamic · axial · delayed · 5.4mm · 0.78mm/px · z∈[-90,+155]mm · 3 of 92 slices shown (2 of 2)]
[im 1/92]
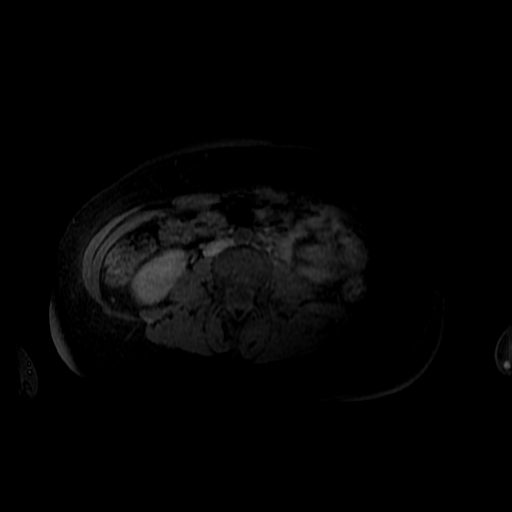
[im 46/92]
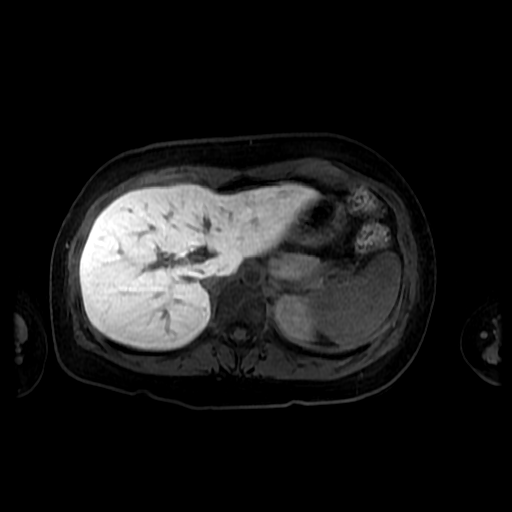
[im 92/92]
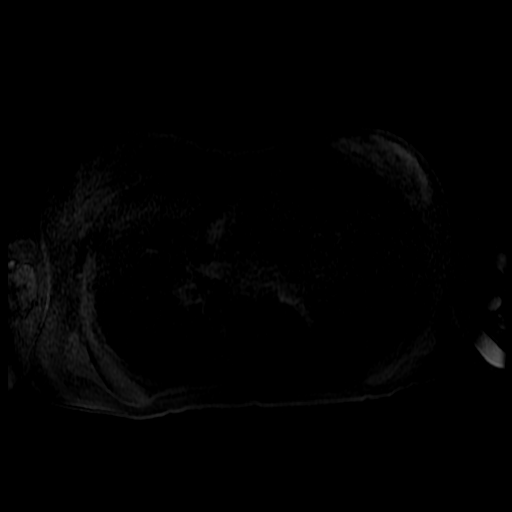

[Series 100: ((id)/(date)..92)-((id)/500/1..92) · axial · 5.4mm · 0.78mm/px · z∈[-90,+155]mm · 3 of 92 slices shown]
[im 1/92]
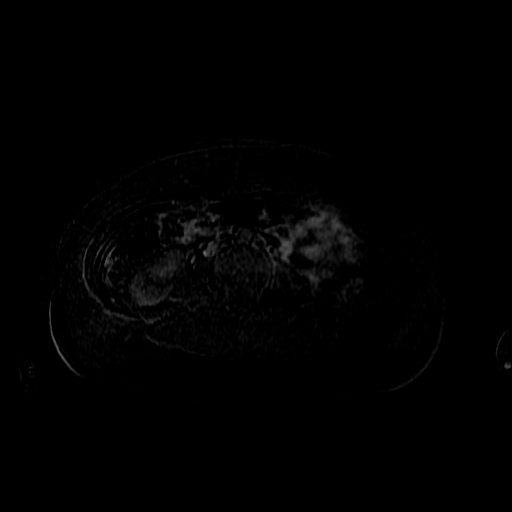
[im 46/92]
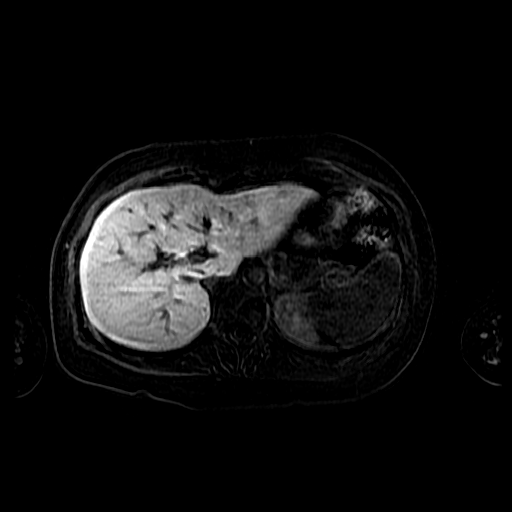
[im 92/92]
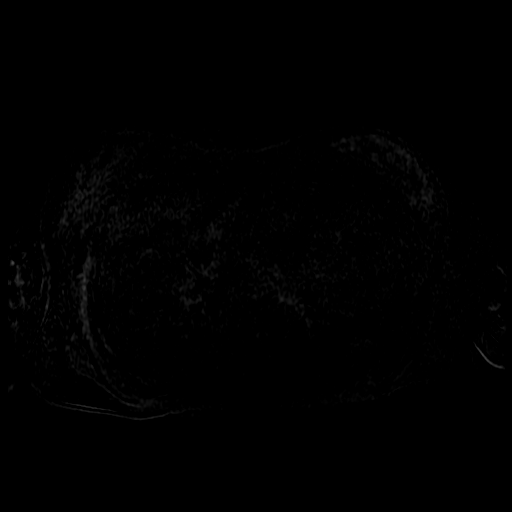

[20 of 48 positions shown; findings below may reference images not displayed]

FINDINGS: Lower chest:  Lung bases are clear.

Hepatobiliary: 2.6 x 4.5 x 4.2 cm enhancing lesion in segment 3,
which retains Eovist contrast on delayed imaging (series 14/image
53), compatible with a benign FNH. Lesion is stealthy on T2 (series
10/image 27). No hepatic steatosis.

Gallbladder is within normal limits. No intrahepatic or extrahepatic
ductal dilatation.

Pancreas: Within normal limits.

Spleen: Within normal limits.

Adrenals/Urinary Tract: Adrenal glands are within normal limits.

Kidneys are within normal limits.  No hydronephrosis.

Stomach/Bowel: Stomach is within normal limits.

Visualized bowel is unremarkable.

Vascular/Lymphatic: No evidence abdominal aortic aneurysm.

No suspicious abdominal lymphadenopathy.

Other: No abdominal ascites.

Musculoskeletal: No focal osseous lesions.
IMPRESSION: 2.6 x 4.5 x 4.2 cm benign FNH in segment 3 of the liver,
corresponding to the sonographic abnormality.

## 2018-06-11 ENCOUNTER — Encounter (HOSPITAL_COMMUNITY): Payer: Self-pay | Admitting: Psychiatry

## 2018-06-11 ENCOUNTER — Ambulatory Visit (INDEPENDENT_AMBULATORY_CARE_PROVIDER_SITE_OTHER): Payer: No Typology Code available for payment source | Admitting: Psychiatry

## 2018-06-11 VITALS — BP 118/64 | HR 72 | Ht 68.5 in | Wt 163.0 lb

## 2018-06-11 DIAGNOSIS — F411 Generalized anxiety disorder: Secondary | ICD-10-CM | POA: Diagnosis not present

## 2018-06-11 DIAGNOSIS — F332 Major depressive disorder, recurrent severe without psychotic features: Secondary | ICD-10-CM

## 2018-06-11 DIAGNOSIS — R450 Nervousness: Secondary | ICD-10-CM

## 2018-06-11 DIAGNOSIS — F900 Attention-deficit hyperactivity disorder, predominantly inattentive type: Secondary | ICD-10-CM

## 2018-06-11 DIAGNOSIS — F4010 Social phobia, unspecified: Secondary | ICD-10-CM | POA: Diagnosis not present

## 2018-06-11 DIAGNOSIS — Z818 Family history of other mental and behavioral disorders: Secondary | ICD-10-CM

## 2018-06-11 MED ORDER — LORAZEPAM 0.5 MG PO TABS: 0.5000 mg | ORAL_TABLET | Freq: Every day | ORAL | 1 refills | Status: DC | PRN

## 2018-06-11 MED ORDER — METHYLPHENIDATE 20 MG/9HR TD PTCH: 1.0000 | MEDICATED_PATCH | Freq: Every day | TRANSDERMAL | 0 refills | Status: DC

## 2018-06-11 MED ORDER — FLUOXETINE HCL 20 MG PO CAPS: 60.0000 mg | ORAL_CAPSULE | Freq: Every day | ORAL | 0 refills | Status: DC

## 2018-06-11 MED ORDER — ARIPIPRAZOLE 2 MG PO TABS: 2.0000 mg | ORAL_TABLET | Freq: Every day | ORAL | 0 refills | Status: DC

## 2018-06-11 MED FILL — LORazepam 0.5 MG TABS: 0.5 | 30 days supply | Qty: 30 | Fill #0

## 2018-06-11 MED FILL — ARIPiprazole 2 MG TABS: 2 | 90 days supply | Qty: 90 | Fill #0

## 2018-06-11 MED FILL — FLUoxetine HCL 20 MG CAPS: 20 | 90 days supply | Qty: 270 | Fill #0

## 2018-06-11 NOTE — Progress Notes (Signed)
BH MD/PA/NP OP Progress Note  06/11/2018 9:31 AM Susan Bauer  MRN:  [id number]  Chief Complaint:  Chief Complaint    Depression; Anxiety; ADHD     HPI: Pt was last seen 07/24/2017.  She is here with her mother today.  Patient states that she has had to reschedule and we have had to reschedule and this is the reason why she has not been seen since September.  Patient reports that she had not consistently been taking Prozac and Abilify in the past and for this reason she got enough to last until this appointment.  She ran out of Daytrana patches several months ago.  And states that she is definitely feeling the effect.  She is very scattered and unmotivated.  She states that she has been making small mistakes at work.  She is mostly concerned as she will be starting classes at [employer] later this month.  It is extremely anxiety provoking due to her previous experiences at school of being bullied.  Patient states she does not have much faith in her academic ability.  The idea of going to class with strangers is also very anxiety provoking to her.  She states that she is worrying a lot and feels stressed and overwhelmed.  It is causing her to want to isolate.  This is also causing her to feel depressed.  She is wanting to isolate but tries to do things around the house anyway.  She is sleeping okay.  Susan Bauer does feel that the Abilify and Prozac are helpful.  In the past when she has been depressed she has not been able to do anything.  This time she is still able to go to work and do things around her house.  She notes that she is doing a lot of stress eating and has gained about 20 pounds over the last several months.  Patient denies SI/HI.  She is going to her OB/GYN and will be starting a new oral contraceptive pills soon.    Visit Diagnosis:    ICD-10-CM   1. GAD (generalized anxiety disorder) F41.1 FLUoxetine (PROZAC) 20 MG capsule    LORazepam (ATIVAN) 0.5 MG tablet  2. 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 Social anxiety disorder F40.10 FLUoxetine (PROZAC) 20 MG capsule    LORazepam (ATIVAN) 0.5 MG tablet  4. Attention deficit hyperactivity disorder (ADHD), predominantly inattentive type F90.0 methylphenidate (DAYTRANA) 20 MG/9HR    methylphenidate (DAYTRANA) 20 MG/9HR    Past Psychiatric History:  Anxiety:Yes Bipolar Disorder:No Depression:Yes Mania:No Psychosis:No Schizophrenia:No Personality Disorder:No Hospitalization for psychiatric illness:No History of Electroconvulsive Shock Therapy:No Prior Suicide Attempts:Yes  Past Medical History:  Past Medical History:  Diagnosis Date  . ADHD (attention deficit hyperactivity disorder)   . Anxiety   . Depression   . Oppositional defiant disorder   . Wears glasses     Past Surgical History:  Procedure Laterality Date  . WISDOM TOOTH EXTRACTION      Family Psychiatric History: Family History  Problem Relation Age of Onset  . Depression Mother     Social History:  Social History   Socioeconomic History  . Marital status: Single    Spouse name: Not on file  . Number of children: Not on file  . Years of education: Not on file  . Highest education level: Not on file  Occupational History  . Not on file  Social Needs  . Financial resource strain: Not on file  . Food insecurity:    Worry: Not on file    Inability: Not on file  . Transportation needs:    Medical: Not on file    Non-medical: Not on file  Tobacco Use  . Smoking status: Never Smoker  . Smokeless tobacco: Never Used  Substance and Sexual Activity  . Alcohol use: Yes    Comment: Occasional beer  . Drug use: No  . Sexual activity: Never  Lifestyle  . Physical activity:    Days per week: Not on file    Minutes per session: Not on file  . Stress: Not on file  Relationships  . Social connections:    Talks on  phone: Not on file    Gets together: Not on file    Attends religious service: Not on file    Active member of club or organization: Not on file    Attends meetings of clubs or organizations: Not on file    Relationship status: Not on file  Other Topics Concern  . Not on file  Social History Narrative  . Not on file    Allergies:  Allergies  Allergen Reactions  . Ritalin [place] [Methylphenidate Hcl Er ([place])] Other (See Comments)    Tachycardia   . Sulfa Antibiotics     Metabolic Disorder Labs: No results found for: HGBA1C, MPG No results found for: PROLACTIN No results found for: CHOL, TRIG, HDL, CHOLHDL, VLDL, LDLCALC No results found for: TSH  Therapeutic Level Labs: No results found for: LITHIUM No results found for: VALPROATE No components found for:  CBMZ  Current Medications: Current Outpatient Medications  Medication Sig Dispense Refill  . ARIPiprazole (ABILIFY) 2 MG tablet Take 1 tablet (2 mg total) by mouth daily. 90 tablet 0  . FLUoxetine (PROZAC) 20 MG capsule Take 3 capsules (60 mg total) by mouth daily. 270 capsule 0  . LORazepam (ATIVAN) 0.5 MG tablet Take 1 tablet (0.5 mg total) by mouth daily as needed for anxiety. 30 tablet 1  . methylphenidate (DAYTRANA) 20 MG/9HR Place 1 patch onto the skin daily. wear patch for 9 hours only each day 30 patch 0  . methylphenidate (DAYTRANA) 20 MG/9HR Place 1 patch onto the skin daily. wear patch for 9 hours only each day 30 patch 0  . Clindamycin Phos-Benzoyl Perox (ONEXTON) 1.2-3.75 % GEL Apply topically.    Marland Kitchen MINOCYCLINE HCL PO Take by mouth.    . norethindrone-ethinyl estradiol (JUNEL FE,GILDESS FE,LOESTRIN FE) 1-20 MG-MCG tablet Take 1 tablet by mouth daily.     No current facility-administered medications for this visit.      Musculoskeletal: Strength & Muscle Tone: within normal limits Gait & Station: normal Patient leans: N/A   Psychiatric Specialty Exam: Physical Exam  Review of Systems  Constitutional:  Negative for chills, fever and weight loss.  Psychiatric/Behavioral: Positive for depression. Negative for hallucinations and suicidal ideas. The patient is nervous/anxious. The patient does not have insomnia.     Blood pressure 118/64, pulse 72, height 5' 8.5" (1.74 m), weight 163 lb (73.9 kg).Body mass index is 24.42 kg/m.  General Appearance: Fairly Groomed  Eye Contact:  Good  Speech:  Clear and Coherent and Normal Rate  Volume:  Normal  Mood:  Anxious and Depressed  Affect:  Congruent  Thought Process:  Goal Directed and Descriptions of Associations: Intact  Orientation:  Full (Time, Place, and Person)  Thought Content:  Logical  Suicidal Thoughts:  No  Homicidal Thoughts:  No  Memory:  Immediate;   Good Recent;   Good Remote;   Good  Judgement:  Fair  Insight:  Good  Psychomotor Activity:  Normal  Concentration:  Concentration: Fair and Attention Span: Fair  Recall:  Good  Fund of Knowledge:  Good  Language:  Good  Akathisia:  No  Handed:  Right  AIMS (if indicated):     Assets:  Communication Skills Desire for [place] Talents/Skills Transportation  ADL's:  Intact  Cognition:  WNL  Sleep:    good      Screenings:    I reviewed the information below on 06/11/2018 and agree Assessment and Plan:  MDD-recurrent, mild; GAD; Social anxiety disorder; ADHD-inattentive type; r/o Eating disorder  The risk of un-intended pregnancy is high based on the fact that pt reports she is not on any birth control. Pt is aware that these meds carry a teratogenic risk. Pt will discuss plan of action if she does or plans to become pregnant in the future.    Medication management with supportive therapy. Risks/benefits and SE of the medication discussed. Pt verbalized understanding and verbal consent obtained for treatment.  Affirm with the patient that the medications are taken as ordered. Patient expressed understanding of how their medications were to be  used.    Meds:Abilify 2mg  po qD for adjunct for mood Prozac 60mg  po qD for depression and anxiety Restart Dayatrana Patch 20mg  p qD for ADHD Restart Ativan 0.5mg  po qD prn anxiety Pt has been taking above medications without SE or AR   Labs: labs at next visit   Therapy: brief supportive therapy provided. Discussed psychosocial stressors in detail.    Consultations: none  Pt denies SI and is at an acute low risk for suicide. Patient told to call clinic if any problems occur. Patient advised to go to ER if they should develop SI/HI, side effects, or if symptoms worsen. Has crisis numbers to call if needed. Pt verbalized understanding.  F/up in 2 months or sooner if needed  Charlcie Cradle, MD 06/11/2018, 9:31 AM

## 2018-07-02 MED FILL — DAYTRANA 20 MG/9 HOUR PATCH: 20 | 30 days supply | Qty: 30 | Fill #0

## 2018-08-13 ENCOUNTER — Encounter (HOSPITAL_COMMUNITY): Payer: Self-pay | Admitting: Psychiatry

## 2018-08-13 ENCOUNTER — Ambulatory Visit (INDEPENDENT_AMBULATORY_CARE_PROVIDER_SITE_OTHER): Payer: No Typology Code available for payment source | Admitting: Psychiatry

## 2018-08-13 ENCOUNTER — Other Ambulatory Visit: Payer: Self-pay

## 2018-08-13 VITALS — BP 119/71 | HR 66 | Resp 14 | Wt 163.8 lb

## 2018-08-13 DIAGNOSIS — F332 Major depressive disorder, recurrent severe without psychotic features: Secondary | ICD-10-CM

## 2018-08-13 DIAGNOSIS — F411 Generalized anxiety disorder: Secondary | ICD-10-CM | POA: Diagnosis not present

## 2018-08-13 DIAGNOSIS — F900 Attention-deficit hyperactivity disorder, predominantly inattentive type: Secondary | ICD-10-CM

## 2018-08-13 DIAGNOSIS — F4010 Social phobia, unspecified: Secondary | ICD-10-CM

## 2018-08-13 MED ORDER — ARIPIPRAZOLE 2 MG PO TABS: 2.0000 mg | ORAL_TABLET | Freq: Every day | ORAL | 0 refills | Status: DC

## 2018-08-13 MED ORDER — FLUOXETINE HCL 20 MG PO CAPS: 60.0000 mg | ORAL_CAPSULE | Freq: Every day | ORAL | 0 refills | Status: DC

## 2018-08-13 MED ORDER — LORAZEPAM 0.5 MG PO TABS: 0.5000 mg | ORAL_TABLET | Freq: Every day | ORAL | 1 refills | Status: AC | PRN

## 2018-08-13 MED ORDER — METHYLPHENIDATE 15 MG/9HR TD PTCH: 15.0000 mg | MEDICATED_PATCH | Freq: Every day | TRANSDERMAL | 0 refills | Status: DC

## 2018-08-13 NOTE — Progress Notes (Signed)
BH MD/PA/NP OP Progress Note  08/13/2018 9:30 AM Susan Bauer  MRN:  [id number]  Chief Complaint:  Chief Complaint    Anxiety     HPI: Pt here with her mother. Pt is in 4 classes at [employer] and is getting A's. She is very stressed about maintaining A's and worries about failing. She spends the majority of the day on campus. She then goes home and naps. Pt is living at home and is sleeping well at night. Susan Bauer does not drive and she takes a bus to/from class. Pt has one friend who she talks to calm down. Pt has cut out caffeine due to anxiety.  Pt has made on friend on campus. Susan Bauer is terrified of disappointing herself by failing. Her parents are proud of all she has accomplished. Pt is using her Daytrana patches and they help her focus but it raise her anxiety. She takes it off after class to focus on studying rather than her anxiety. Pt takes Ativan when she can't sleep but hasn't needed it since the 1st week of school. Mood has been "ok". Pt does not feel like she is depressed. She has been social and is going out every Friday. Pt has made some friends thru another friend. Pt has a hx of getting more depressed in the fall. Pt denies SI/HI. Pt quit her job due to lack of accomodation of her schedule.   Visit Diagnosis:    ICD-10-CM   1. GAD (generalized anxiety disorder) F41.1 FLUoxetine (PROZAC) 20 MG capsule    LORazepam (ATIVAN) 0.5 MG tablet  2. Major depressive disorder, recurrent, severe without psychotic features ([place]) F33.2 FLUoxetine (PROZAC) 20 MG capsule    ARIPiprazole (ABILIFY) 2 MG tablet  3. Social anxiety disorder F40.10 FLUoxetine (PROZAC) 20 MG capsule    LORazepam (ATIVAN) 0.5 MG tablet  4. Attention deficit hyperactivity disorder (ADHD), predominantly inattentive type F90.0 methylphenidate (DAYTRANA) 15 mg/9hr    methylphenidate (DAYTRANA) 15 mg/9hr      Past Psychiatric History:  Anxiety:Yes Bipolar  Disorder:No Depression:Yes Mania:No Psychosis:No Schizophrenia:No Personality Disorder:No Hospitalization for psychiatric illness:No History of Electroconvulsive Shock Therapy:No Prior Suicide Attempts:Yes  Past Medical History:  Past Medical History:  Diagnosis Date  . ADHD (attention deficit hyperactivity disorder)   . Anxiety   . Depression   . Oppositional defiant disorder   . Wears glasses     Past Surgical History:  Procedure Laterality Date  . WISDOM TOOTH EXTRACTION      Family Psychiatric History: Family History  Problem Relation Age of Onset  . Depression Mother     Social History:  Social History   Socioeconomic History  . Marital status: Single    Spouse name: Not on file  . Number of children: Not on file  . Years of education: Not on file  . Highest education level: Not on file  Occupational History  . Not on file  Social Needs  . Financial resource strain: Not on file  . Food insecurity:    Worry: Not on file    Inability: Not on file  . Transportation needs:    Medical: Not on file    Non-medical: Not on file  Tobacco Use  . Smoking status: Never Smoker  . Smokeless tobacco: Never Used  Substance and Sexual Activity  . Alcohol use: Yes    Comment: Occasional beer  . Drug use: No  . Sexual activity: Never  Lifestyle  . Physical activity:    Days per week: Not on  file    Minutes per session: Not on file  . Stress: Not on file  Relationships  . Social connections:    Talks on phone: Not on file    Gets together: Not on file    Attends religious service: Not on file    Active member of club or organization: Not on file    Attends meetings of clubs or organizations: Not on file    Relationship status: Not on file  Other Topics Concern  . Not on file  Social History Narrative  . Not on file    Allergies:  Allergies  Allergen Reactions  . Ritalin [place] [Methylphenidate Hcl Er (La)] Other (See Comments)    Tachycardia   .  Sulfa Antibiotics     Metabolic Disorder Labs: No results found for: HGBA1C, MPG No results found for: PROLACTIN No results found for: CHOL, TRIG, HDL, CHOLHDL, VLDL, LDLCALC No results found for: TSH  Therapeutic Level Labs: No results found for: LITHIUM No results found for: VALPROATE No components found for:  CBMZ  Current Medications: Current Outpatient Medications  Medication Sig Dispense Refill  . ARIPiprazole (ABILIFY) 2 MG tablet Take 1 tablet (2 mg total) by mouth daily. 90 tablet 0  . FLUoxetine (PROZAC) 20 MG capsule Take 3 capsules (60 mg total) by mouth daily. 270 capsule 0  . LORazepam (ATIVAN) 0.5 MG tablet Take 1 tablet (0.5 mg total) by mouth daily as needed for anxiety. 30 tablet 1  . Clindamycin Phos-Benzoyl Perox (ONEXTON) 1.2-3.75 % GEL Apply topically.    . methylphenidate (DAYTRANA) 15 mg/9hr Place 1 patch (15 mg total) onto the skin daily. wear patch for 9 hours only each day 30 patch 0  . methylphenidate (DAYTRANA) 15 mg/9hr Place 1 patch (15 mg total) onto the skin daily. wear patch for 9 hours only each day 30 patch 0  . MINOCYCLINE HCL PO Take by mouth.    . norethindrone-ethinyl estradiol (JUNEL FE,GILDESS FE,LOESTRIN FE) 1-20 MG-MCG tablet Take 1 tablet by mouth daily.     No current facility-administered medications for this visit.    Musculoskeletal: Strength & Muscle Tone: within normal limits Gait & Station: normal Patient leans: N/A   Psychiatric Specialty Exam: Review of Systems  Constitutional: Negative for chills, diaphoresis and fever.  Respiratory: Negative for cough, shortness of breath and wheezing.     Blood pressure 119/71, pulse 66, resp. rate 14, weight 163 lb 12.8 oz (74.3 kg), SpO2 99 %.Body mass index is 24.54 kg/m.  General Appearance: Fairly Groomed  Eye Contact:  Good  Speech:  Clear and Coherent and Normal Rate  Volume:  Normal  Mood:  Anxious  Affect:  Congruent  Thought Process:  Goal Directed and Descriptions of  Associations: Intact  Orientation:  Full (Time, Place, and Person)  Thought Content:  Logical  Suicidal Thoughts:  No  Homicidal Thoughts:  No  Memory:  Immediate;   Good  Judgement:  Good  Insight:  Good  Psychomotor Activity:  Normal  Concentration:  Concentration: Good  Recall:  Good  Fund of Knowledge:  Good  Language:  Good  Akathisia:  No  Handed:  Right  AIMS (if indicated):     Assets:  Communication Skills Desire for Improvement Financial Resources/Insurance Housing Leisure Time [place] Talents/Skills Transportation Vocational/Educational  ADL's:  Intact  Cognition:  WNL  Sleep:   good        Screenings:    I reviewed the information below on  08/13/2018 and have updated it Assessment and Plan:  MDD-recurrent, mild; GAD; Social anxiety disorder; ADHD-inattentive type; r/o Eating disorder  The risk of un-intended pregnancy is low based on the fact that pt reports she is using birth control. Pt is aware that these meds carry a teratogenic risk. Pt will discuss plan of action if she does or plans to become pregnant in the future.    Medication management with supportive therapy. Risks/benefits and SE of the medication discussed. Pt verbalized understanding and verbal consent obtained for treatment.  Affirm with the patient that the medications are taken as ordered. Patient expressed understanding of how their medications were to be used.    Meds: Abilify 2mg  po qD for adjunct for mood Prozac 60mg  po qD for depression and anxiety decrease Dayatrana Patch 15mg  p qD for ADHD Ativan 0.5mg  po qD prn anxiety Pt has been taking above medications without SE or AR   Labs: none   Therapy: brief supportive therapy provided. Discussed psychosocial stressors in detail.    Consultations: none  Pt denies SI and is at an acute low risk for suicide. Patient told to call clinic if any problems occur. Patient advised to go to ER if they  should develop SI/HI, side effects, or if symptoms worsen. Has crisis numbers to call if needed. Pt verbalized understanding.  F/up in 2 months or sooner if needed  Charlcie Cradle, MD 08/13/2018, 9:30 AM

## 2018-09-01 MED FILL — LORazepam 0.5 MG TABS: 0.5 | 30 days supply | Qty: 30 | Fill #0

## 2018-09-01 MED FILL — DAYTRANA 15 MG/9 HR PATCH: 15 | 30 days supply | Qty: 30 | Fill #0

## 2018-10-15 ENCOUNTER — Encounter (HOSPITAL_COMMUNITY): Payer: Self-pay | Admitting: Psychiatry

## 2018-10-15 ENCOUNTER — Ambulatory Visit (INDEPENDENT_AMBULATORY_CARE_PROVIDER_SITE_OTHER): Payer: No Typology Code available for payment source | Admitting: Psychiatry

## 2018-10-15 DIAGNOSIS — F332 Major depressive disorder, recurrent severe without psychotic features: Secondary | ICD-10-CM | POA: Diagnosis not present

## 2018-10-15 DIAGNOSIS — F411 Generalized anxiety disorder: Secondary | ICD-10-CM | POA: Diagnosis not present

## 2018-10-15 DIAGNOSIS — F900 Attention-deficit hyperactivity disorder, predominantly inattentive type: Secondary | ICD-10-CM | POA: Diagnosis not present

## 2018-10-15 DIAGNOSIS — F4010 Social phobia, unspecified: Secondary | ICD-10-CM

## 2018-10-15 MED ORDER — METHYLPHENIDATE 15 MG/9HR TD PTCH: 15.0000 mg | MEDICATED_PATCH | Freq: Every day | TRANSDERMAL | 0 refills | Status: DC

## 2018-10-15 MED ORDER — FLUOXETINE HCL 20 MG PO CAPS: 60.0000 mg | ORAL_CAPSULE | Freq: Every day | ORAL | 0 refills | Status: DC

## 2018-10-15 MED ORDER — ARIPIPRAZOLE 2 MG PO TABS: 2.0000 mg | ORAL_TABLET | Freq: Every day | ORAL | 0 refills | Status: DC

## 2018-10-15 MED ORDER — METHYLPHENIDATE 15 MG/9HR TD PTCH
15.0000 mg | MEDICATED_PATCH | Freq: Every day | TRANSDERMAL | 0 refills | Status: DC
Start: 2018-10-15 — End: 2019-01-14

## 2018-10-15 MED FILL — ARIPiprazole 2 MG TABS: 2 | 90 days supply | Qty: 90 | Fill #0

## 2018-10-15 MED FILL — FLUoxetine HCL 20 MG CAPS: 20 | 90 days supply | Qty: 270 | Fill #0

## 2018-10-15 MED FILL — DAYTRANA 15 MG/9 HR PATCH: 15 | 30 days supply | Qty: 30 | Fill #0

## 2018-10-15 NOTE — Progress Notes (Signed)
[facility] MD/PA/NP OP Progress Note  10/15/2018 10:12 AM Susan Bauer  MRN:  [id number]  Chief Complaint:  Chief Complaint    Follow-up     HPI: Patient is here with her mother.  Patient tells me that she is doing well.  She has 3A's and 1B.  She is studying for her finals but is not worried.  Patient has made some friends and has gone to a party that she actually enjoyed.  Patient is planning to host a party at her home over the holiday break.  She is very proud of all of her accomplishments.  She is doing well in school and her social anxiety and her generalized anxiety are well controlled.  She is denying any depression symptoms.  She denies any SI/HI.  She states that the [place] patch is effective she is able to focus and concentrate on her schoolwork as well as in class.  Mykaila has not taken Ativan since the beginning of the school semester.   Visit Diagnosis:    ICD-10-CM   1. 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 Social anxiety disorder F40.10 FLUoxetine (PROZAC) 20 MG capsule  3. GAD (generalized anxiety disorder) F41.1 FLUoxetine (PROZAC) 20 MG capsule  4. Attention deficit hyperactivity disorder (ADHD), predominantly inattentive type F90.0 methylphenidate (DAYTRANA) 15 mg/9hr    methylphenidate (DAYTRANA) 15 mg/9hr    methylphenidate (DAYTRANA) 15 mg/9hr      Past Psychiatric History:  Anxiety:Yes Bipolar Disorder:No Depression:Yes Mania:No Psychosis:No Schizophrenia:No Personality Disorder:No Hospitalization for psychiatric illness:No History of Electroconvulsive Shock Therapy:No Prior Suicide Attempts:Yes  Past Medical History:  Past Medical History:  Diagnosis Date  . ADHD (attention deficit hyperactivity disorder)   . Anxiety   . Depression   . Oppositional defiant disorder   . Wears glasses     Past Surgical History:  Procedure  Laterality Date  . WISDOM TOOTH EXTRACTION      Family Psychiatric History: Family History  Problem Relation Age of Onset  . Depression Mother     Social History:  Social History   Socioeconomic History  . Marital status: Single    Spouse name: Not on file  . Number of children: Not on file  . Years of education: Not on file  . Highest education level: Not on file  Occupational History  . Not on file  Social Needs  . Financial resource strain: Not on file  . Food insecurity:    Worry: Not on file    Inability: Not on file  . Transportation needs:    Medical: Not on file    Non-medical: Not on file  Tobacco Use  . Smoking status: Never Smoker  . Smokeless tobacco: Never Used  Substance and Sexual Activity  . Alcohol use: Yes    Comment: Occasional beer  . Drug use: No  . Sexual activity: Never  Lifestyle  . Physical activity:    Days per week: Not on file    Minutes per session: Not on file  . Stress: Not on file  Relationships  . Social connections:    Talks on phone: Not on file    Gets together: Not on file    Attends religious service: Not on file    Active member of club or organization: Not on file    Attends meetings of clubs or organizations: Not on file    Relationship status: Not on  file  Other Topics Concern  . Not on file  Social History Narrative  . Not on file    Allergies:  Allergies  Allergen Reactions  . Ritalin [place] [Methylphenidate Hcl Er ([place])] Other (See Comments)    Tachycardia   . Sulfa Antibiotics     Metabolic Disorder Labs: No results found for: HGBA1C, MPG No results found for: PROLACTIN No results found for: CHOL, TRIG, HDL, CHOLHDL, VLDL, LDLCALC No results found for: TSH  Therapeutic Level Labs: No results found for: LITHIUM No results found for: VALPROATE No components found for:  CBMZ  Current Medications: Current Outpatient Medications  Medication Sig Dispense Refill  . ARIPiprazole (ABILIFY) 2 MG tablet Take 1  tablet (2 mg total) by mouth daily. 90 tablet 0  . Clindamycin Phos-Benzoyl Perox (ONEXTON) 1.2-3.75 % GEL Apply topically.    Marland Kitchen FLUoxetine (PROZAC) 20 MG capsule Take 3 capsules (60 mg total) by mouth daily. 270 capsule 0  . LORazepam (ATIVAN) 0.5 MG tablet Take 1 tablet (0.5 mg total) by mouth daily as needed for anxiety. 30 tablet 1  . methylphenidate (DAYTRANA) 15 mg/9hr Place 1 patch (15 mg total) onto the skin daily. wear patch for 9 hours only each day 30 patch 0  . methylphenidate (DAYTRANA) 15 mg/9hr Place 1 patch (15 mg total) onto the skin daily. wear patch for 9 hours only each day 30 patch 0  . MINOCYCLINE HCL PO Take by mouth.    . norethindrone-ethinyl estradiol (JUNEL FE,GILDESS FE,LOESTRIN FE) 1-20 MG-MCG tablet Take 1 tablet by mouth daily.    . methylphenidate (DAYTRANA) 15 mg/9hr Place 1 patch (15 mg total) onto the skin daily. wear patch for 9 hours only each day 30 patch 0   No current facility-administered medications for this visit.    Musculoskeletal: Strength & Muscle Tone: within normal limits Gait & Station: normal Patient leans: N/A  Psychiatric Specialty Exam: Review of Systems  Constitutional: Negative for chills, diaphoresis and fever.  HENT: Negative for congestion, sinus pain and sore throat.     Blood pressure 109/70, pulse 70, height 5' 8.5" (1.74 m), weight 168 lb (76.2 kg), SpO2 97 %.Body mass index is 25.17 kg/m.  General Appearance: Fairly Groomed  Eye Contact:  Good  Speech:  Clear and Coherent and Normal Rate  Volume:  Normal  Mood:  Euthymic  Affect:  Full Range  Thought Process:  Goal Directed and Descriptions of Associations: Intact  Orientation:  Full (Time, Place, and Person)  Thought Content:  Logical  Suicidal Thoughts:  No  Homicidal Thoughts:  No  Memory:  Immediate;   Good  Judgement:  Good  Insight:  Good  Psychomotor Activity:  Normal  Concentration:  Concentration: Good  Recall:  Good  Fund of Knowledge:  Good  Language:   Good  Akathisia:  No  Handed:  Right  AIMS (if indicated):     Assets:  Communication Skills Desire for Improvement Housing Leisure Time [place] Talents/Skills Transportation Vocational/Educational  ADL's:  Intact  Cognition:  WNL  Sleep:   good       Screenings:    I reviewed the information below on 10/15/2018 and have updated it Assessment and Plan:  MDD-recurrent, mild; GAD; Social anxiety disorder; ADHD-inattentive type; r/o Eating disorder  The risk of un-intended pregnancy is low based on the fact that pt reports she is using birth control. Pt is aware that these meds carry a teratogenic risk. Pt will discuss plan of  action if she does or plans to become pregnant in the future.    Medication management with supportive therapy. Risks/benefits and SE of the medication discussed. Pt verbalized understanding and verbal consent obtained for treatment.  Affirm with the patient that the medications are taken as ordered. Patient expressed understanding of how their medications were to be used.    Meds: Abilify 2mg  po qD for adjunct for mood Prozac 60mg  po qD for depression and anxiety  Dayatrana Patch 15mg  p qD for ADHD-she is tolerating the decreased dose well and it remains effective Ativan 0.5mg  po qD prn anxiety- no refill today Pt has been taking above medications without SE or AR   Labs: none   Therapy: brief supportive therapy provided. Discussed psychosocial stressors in detail.    Consultations: none  Pt denies SI and is at an acute low risk for suicide. Patient told to call clinic if any problems occur. Patient advised to go to ER if they should develop SI/HI, side effects, or if symptoms worsen. Has crisis numbers to call if needed. Pt verbalized understanding.  F/up in 3 months or sooner if needed  Charlcie Cradle, MD 10/15/2018, 10:12 AM

## 2019-01-14 ENCOUNTER — Ambulatory Visit (HOSPITAL_COMMUNITY): Payer: No Typology Code available for payment source | Admitting: Psychiatry

## 2019-01-14 ENCOUNTER — Encounter (HOSPITAL_COMMUNITY): Payer: Self-pay | Admitting: Psychiatry

## 2019-01-14 VITALS — BP 118/66 | Ht 68.5 in | Wt 177.0 lb

## 2019-01-14 DIAGNOSIS — F4010 Social phobia, unspecified: Secondary | ICD-10-CM

## 2019-01-14 DIAGNOSIS — F411 Generalized anxiety disorder: Secondary | ICD-10-CM | POA: Diagnosis not present

## 2019-01-14 DIAGNOSIS — F900 Attention-deficit hyperactivity disorder, predominantly inattentive type: Secondary | ICD-10-CM | POA: Diagnosis not present

## 2019-01-14 DIAGNOSIS — F332 Major depressive disorder, recurrent severe without psychotic features: Secondary | ICD-10-CM

## 2019-01-14 MED ORDER — ARIPIPRAZOLE 2 MG PO TABS: 2.0000 mg | ORAL_TABLET | Freq: Every day | ORAL | 0 refills | Status: DC

## 2019-01-14 MED ORDER — METHYLPHENIDATE 15 MG/9HR TD PTCH: 15.0000 mg | MEDICATED_PATCH | Freq: Every day | TRANSDERMAL | 0 refills | Status: DC

## 2019-01-14 MED ORDER — FLUOXETINE HCL 20 MG PO CAPS: 60.0000 mg | ORAL_CAPSULE | Freq: Every day | ORAL | 0 refills | Status: DC

## 2019-01-14 MED FILL — ARIPiprazole 2 MG TABS: 2 | 90 days supply | Qty: 90 | Fill #0

## 2019-01-14 MED FILL — DAYTRANA 15 MG/9 HR PATCH: 15 | 30 days supply | Qty: 30 | Fill #0

## 2019-01-14 MED FILL — FLUoxetine HCL 20 MG CAPS: 20 | 90 days supply | Qty: 270 | Fill #0

## 2019-01-14 NOTE — Progress Notes (Signed)
[facility] MD/PA/NP OP Progress Note  01/14/2019 11:00 AM Susan Bauer  MRN:  [id number]  Chief Complaint:  Chief Complaint    Anxiety; Depression; ADHD; Follow-up     HPI: Susan Bauer is here with her mother.  Both patient and mom state that Susan Bauer is doing really well.  She finished up her last semester and was happy with the result.  She is currently on spring break and is glad to have a little time off.  She has been social despite her social anxiety.  She is made a friend and they meet once a week to have lunch and study together.  She has been talking with her classmates as well.  Her overall anxiety is mild and more situational.  She has not been taking any benzos for many months now.  Her sleep is good.  She is denying any depression.  For short period of time she was worried about her weight and she and her mother have started exercising at the gym.  That is been helpful.  Mom and patient states that the patient is eating normal-sized meals 2-3 times a day.  The Daytrana patch is been effective and she is able to concentrate in class and studying at school.  She notes that it does help with energy to.  She typically does not wear it on weekends as it will affect her sleep.  Susan Bauer denies SI/HI.   Visit Diagnosis:    ICD-10-CM   1. Attention deficit hyperactivity disorder (ADHD), predominantly inattentive type F90.0 methylphenidate (DAYTRANA) 15 mg/9hr    methylphenidate (DAYTRANA) 15 mg/9hr    methylphenidate (DAYTRANA) 15 mg/9hr  2. 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 Social anxiety disorder F40.10 FLUoxetine (PROZAC) 20 MG capsule  4. GAD (generalized anxiety disorder) F41.1 FLUoxetine (PROZAC) 20 MG capsule      Past Psychiatric History:  Anxiety:Yes Bipolar Disorder:No Depression:Yes Mania:No Psychosis:No Schizophrenia:No Personality  Disorder:No Hospitalization for psychiatric illness:No History of Electroconvulsive Shock Therapy:No Prior Suicide Attempts:Yes  Past Medical History:  Past Medical History:  Diagnosis Date  . ADHD (attention deficit hyperactivity disorder)   . Anxiety   . Depression   . Oppositional defiant disorder   . Wears glasses     Past Surgical History:  Procedure Laterality Date  . WISDOM TOOTH EXTRACTION      Family Psychiatric History: Family History  Problem Relation Age of Onset  . Depression Mother     Social History:  Social History   Socioeconomic History  . Marital status: Single    Spouse name: Not on file  . Number of children: Not on file  . Years of education: Not on file  . Highest education level: Not on file  Occupational History  . Not on file  Social Needs  . Financial resource strain: Not on file  . Food insecurity:    Worry: Not on file    Inability: Not on file  . Transportation needs:    Medical: Not on file    Non-medical: Not on file  Tobacco Use  . Smoking status: Never Smoker  . Smokeless tobacco: Never Used  Substance and Sexual Activity  . Alcohol use: Yes    Comment: Occasional beer  . Drug use: No  . Sexual activity: Never  Lifestyle  . Physical activity:    Days per week: Not on file    Minutes per session: Not  on file  . Stress: Not on file  Relationships  . Social connections:    Talks on phone: Not on file    Gets together: Not on file    Attends religious service: Not on file    Active member of club or organization: Not on file    Attends meetings of clubs or organizations: Not on file    Relationship status: Not on file  Other Topics Concern  . Not on file  Social History Narrative  . Not on file    Allergies:  Allergies  Allergen Reactions  . Ritalin La [Methylphenidate Hcl Er (La)] Other (See Comments)    Tachycardia   . Sulfa Antibiotics     Metabolic Disorder Labs: No results found for: HGBA1C, MPG No  results found for: PROLACTIN No results found for: CHOL, TRIG, HDL, CHOLHDL, VLDL, LDLCALC No results found for: TSH  Therapeutic Level Labs: No results found for: LITHIUM No results found for: VALPROATE No components found for:  CBMZ  Current Medications: Current Outpatient Medications  Medication Sig Dispense Refill  . ARIPiprazole (ABILIFY) 2 MG tablet Take 1 tablet (2 mg total) by mouth daily. 90 tablet 0  . FLUoxetine (PROZAC) 20 MG capsule Take 3 capsules (60 mg total) by mouth daily. 270 capsule 0  . LORazepam (ATIVAN) 0.5 MG tablet Take 1 tablet (0.5 mg total) by mouth daily as needed for anxiety. 30 tablet 1  . methylphenidate (DAYTRANA) 15 mg/9hr Place 1 patch (15 mg total) onto the skin daily. wear patch for 9 hours only each day 30 patch 0  . methylphenidate (DAYTRANA) 15 mg/9hr Place 1 patch (15 mg total) onto the skin daily. wear patch for 9 hours only each day 30 patch 0  . methylphenidate (DAYTRANA) 15 mg/9hr Place 1 patch (15 mg total) onto the skin daily. wear patch for 9 hours only each day 30 patch 0  . norethindrone-ethinyl estradiol (JUNEL FE,GILDESS FE,LOESTRIN FE) 1-20 MG-MCG tablet Take 1 tablet by mouth daily.     No current facility-administered medications for this visit.    Musculoskeletal: Strength & Muscle Tone: within normal limits Gait & Station: normal Patient leans: N/A  Psychiatric Specialty Exam: Review of Systems  Constitutional: Negative for chills, diaphoresis and fever.  Respiratory: Negative for cough, sputum production and shortness of breath.     Blood pressure 118/66, height 5' 8.5" (1.74 m), weight 177 lb (80.3 kg).Body mass index is 26.52 kg/m.  General Appearance: Fairly Groomed  Eye Contact:  Good  Speech:  Clear and Coherent and Normal Rate  Volume:  Normal  Mood:  Euthymic  Affect:  Full Range  Thought Process:  Goal Directed and Descriptions of Associations: Intact  Orientation:  Full (Time, Place, and Person)  Thought  Content:  Logical  Suicidal Thoughts:  No  Homicidal Thoughts:  No  Memory:  Immediate;   Good  Judgement:  Good  Insight:  Good  Psychomotor Activity:  Normal  Concentration:  Concentration: Good  Recall:  Good  Fund of Knowledge:  Good  Language:  Good  Akathisia:  No  Handed:  Right  AIMS (if indicated):     Assets:  Communication Skills Desire for Improvement Financial Resources/Insurance Housing Leisure Time [place] Talents/Skills Transportation Vocational/Educational  ADL's:  Intact  Cognition:  WNL  Sleep:   good         Screenings:    I reviewed the information below on 01/14/2019 and have updated it Assessment and  Plan:  MDD-recurrent, mild; GAD; Social anxiety disorder; ADHD-inattentive type; r/o Eating disorder  The risk of un-intended pregnancy is low based on the fact that pt reports she is using birth control. Pt is aware that these meds carry a teratogenic risk. Pt will discuss plan of action if she does or plans to become pregnant in the future.  Status of current symptoms: stable   Medication management with supportive therapy. Risks/benefits and SE of the medication discussed. Pt verbalized understanding and verbal consent obtained for treatment.  Affirm with the patient that the medications are taken as ordered. Patient expressed understanding of how their medications were to be used.    Meds: Abilify 2mg  po qD for adjunct for mood Prozac 60mg  po qD for depression and anxiety  Dayatrana Patch 15mg  p qD for ADHD-she is tolerating the decreased dose well and it remains effective Ativan 0.5mg  po qD prn anxiety- no refill today, pt is not taking it Pt has been taking above medications without SE or AR   Labs: none   Therapy: brief supportive therapy provided. Discussed psychosocial stressors in detail.    Consultations: none  Pt denies SI and is at an acute low risk for suicide. Patient told to call clinic if  any problems occur. Patient advised to go to ER if they should develop SI/HI, side effects, or if symptoms worsen. Has crisis numbers to call if needed. Pt verbalized understanding.  F/up in 3 months or sooner if needed  Charlcie Cradle, MD 01/14/2019, 11:00 AM

## 2019-01-19 ENCOUNTER — Telehealth: Payer: No Typology Code available for payment source | Admitting: Physician Assistant

## 2019-01-19 DIAGNOSIS — J069 Acute upper respiratory infection, unspecified: Secondary | ICD-10-CM

## 2019-01-19 NOTE — Progress Notes (Signed)
We are sorry you are not feeling well.  Here is how we plan to help!  Based on what you have shared with me, it looks like you may have a viral upper respiratory infection. Specifically, I think you are likely suffering from adenovirus given that both your eyes as throat are involved.  Upper respiratory infections are caused by a large number of viruses; however, rhinovirus is the most common cause.   Symptoms vary from person to person, with common symptoms including sore throat, cough, and fatigue or lack of energy.  A low-grade fever of up to 100.4 may present, but is often uncommon.  Symptoms vary however, and are closely related to a person's age or underlying illnesses.  The most common symptoms associated with an upper respiratory infection are nasal discharge or congestion, cough, sneezing, headache and pressure in the ears and face.  These symptoms usually persist for about 3 to 10 days, but can last up to 2 weeks.  It is important to know that upper respiratory infections do not cause serious illness or complications in most cases.    Upper respiratory infections can be transmitted from person to person, with the most common method of transmission being a person's hands.  The virus is able to live on the skin and can infect other persons for up to 2 hours after direct contact.  Also, these can be transmitted when someone coughs or sneezes; thus, it is important to cover the mouth to reduce this risk.  To keep the spread of the illness at [place], good hand hygiene is very important.  This is an infection that is most likely caused by a virus. There are no specific treatments other than to help you with the symptoms until the infection runs its course.  We are sorry you are not feeling well.  Here is how we plan to help!   For nasal congestion, you may use an oral decongestants such as Mucinex D or if you have glaucoma or high blood pressure use plain Mucinex.  Saline nasal spray or nasal drops can  help and can safely be used as often as needed for congestion.   If you do not have a history of heart disease, hypertension, diabetes or thyroid disease, prostate/bladder issues or glaucoma, you may also use Sudafed to treat nasal congestion.  It is highly recommended that you consult with a pharmacist or your primary care physician to ensure this medication is safe for you to take.     If you have a cough, you may use cough suppressants such as Delsym and Robitussin.  If you have glaucoma or high blood pressure, you can also use Coricidin HBP.    If you have a sore or scratchy throat, use a saltwater gargle-  to  teaspoon of salt dissolved in a 4-ounce to 8-ounce glass of warm water.  Gargle the solution for approximately 15-30 seconds and then spit.  It is important not to swallow the solution.  You can also use throat lozenges/cough drops and Chloraseptic spray to help with throat pain or discomfort.  Warm or cold liquids can also be helpful in relieving throat pain.  For headache, pain or general discomfort, you can use Ibuprofen or Tylenol as directed.   Some authorities believe that zinc sprays or the use of Echinacea may shorten the course of your symptoms.   HOME CARE Only take medications as instructed by your medical team. Be sure to drink plenty of fluids. Water is fine  as well as fruit juices, sodas and electrolyte beverages. You may want to stay away from caffeine or alcohol. If you are nauseated, try taking small sips of liquids. How do you know if you are getting enough fluid? Your urine should be a pale yellow or almost colorless. Get rest. Taking a steamy shower or using a humidifier may help nasal congestion and ease sore throat pain. You can place a towel over your head and breathe in the steam from hot water coming from a faucet. Using a saline nasal spray works much the same way. Cough drops, hard candies and sore throat lozenges may ease your cough. Avoid close contacts  especially the very young and the elderly Cover your mouth if you cough or sneeze Always remember to wash your hands.   GET HELP RIGHT AWAY IF: You develop worsening fever. If your symptoms do not improve within 10 days You develop yellow or green discharge from your nose over 3 days. You have coughing fits You develop a severe head ache or visual changes. You develop shortness of breath, difficulty breathing or start having chest pain  Your symptoms persist after you have completed your treatment plan  MAKE SURE YOU  Understand these instructions. Will watch your condition. Will get help right away if you are not doing well or get worse.  Your e-visit answers were reviewed by a board certified advanced clinical practitioner to complete your personal care plan. Depending upon the condition, your plan could have included both over the counter or prescription medications. Please review your pharmacy choice. If there is a problem, you may call our nursing hot line at and have the prescription routed to another pharmacy. Your safety is important to [place]. If you have drug allergies check your prescription carefully.   You can use MyChart to ask questions about today's visit, request a non-urgent call back, or ask for a work or school excuse for 24 hours related to this e-Visit. If it has been greater than 24 hours you will need to follow up with your provider, or enter a new e-Visit to address those concerns. You will get an e-mail in the next two days asking about your experience.  I hope that your e-visit has been valuable and will speed your recovery. Thank you for using e-visits.      ===View-only below this line===   ----- Message -----    From: [name]    Sent: 01/19/2019  1:40 PM EDT      To: E-Visit Mailing List Subject: E-Visit Submission: Sore Throat  E-Visit Submission: Sore Throat --------------------------------  Question: Do you have any of the following symptoms  (please select all that apply)? Answer:   Irritation or redness of the eyes            Change in the quality of your voice            Pain in the throat when swallowing  Question: How long have you been having these symptoms? Answer:   2 days  Question: Do you have a fever? Answer:   Yes, I have a low fever (less than 101 degrees)  Question: How long have you had the fever? Answer:   Just today  Question: Are you in close contact with anyone who has similar symptoms ? Answer:   No  Question: Are you taking any over the counter medications for your symptoms? Answer:   No  Question: Please list your medication allergies that you may  have ? (If 'none' , please list as 'none') Answer:   Sulfa Drugs            Methylphenidate  Question: Please list any additional comments  Answer:   I will need a doctor's note to excuse my absence from school.  Question: Are you pregnant? Answer:   I am confident that I am not pregnant  Question: Are you breastfeeding? Answer:   No

## 2019-04-15 ENCOUNTER — Ambulatory Visit (HOSPITAL_COMMUNITY): Payer: No Typology Code available for payment source | Admitting: Psychiatry

## 2019-04-15 ENCOUNTER — Encounter (HOSPITAL_COMMUNITY): Payer: Self-pay | Admitting: Psychiatry

## 2019-04-15 ENCOUNTER — Other Ambulatory Visit: Payer: Self-pay

## 2019-04-15 DIAGNOSIS — F332 Major depressive disorder, recurrent severe without psychotic features: Secondary | ICD-10-CM

## 2019-04-15 DIAGNOSIS — F4010 Social phobia, unspecified: Secondary | ICD-10-CM

## 2019-04-15 DIAGNOSIS — F900 Attention-deficit hyperactivity disorder, predominantly inattentive type: Secondary | ICD-10-CM

## 2019-04-15 DIAGNOSIS — F411 Generalized anxiety disorder: Secondary | ICD-10-CM

## 2019-04-15 MED ORDER — METHYLPHENIDATE 15 MG/9HR TD PTCH: 15.0000 mg | MEDICATED_PATCH | Freq: Every day | TRANSDERMAL | 0 refills | Status: DC

## 2019-04-15 MED ORDER — ARIPIPRAZOLE 2 MG PO TABS: 2.0000 mg | ORAL_TABLET | Freq: Every day | ORAL | 0 refills | Status: DC

## 2019-04-15 MED ORDER — FLUOXETINE HCL 20 MG PO CAPS: 60.0000 mg | ORAL_CAPSULE | Freq: Every day | ORAL | 0 refills | Status: DC

## 2019-04-15 MED FILL — DAYTRANA 15 MG/9 HR PATCH: 15 | 30 days supply | Qty: 30 | Fill #0

## 2019-04-15 MED FILL — FLUoxetine HCL 20 MG CAPS: 20 | 90 days supply | Qty: 270 | Fill #0

## 2019-04-15 MED FILL — ARIPiprazole 2 MG TABS: 2 | 90 days supply | Qty: 90 | Fill #0

## 2019-04-15 NOTE — Progress Notes (Unsigned)
Virtual Visit via Telephone Note  I connected with Susan Bauer on 04/15/19 at  9:15 AM EDT by telephone and verified that I am speaking with the correct person using two identifiers.  Location: Patient: home Provider: office   I discussed the limitations, risks, security and privacy concerns of performing an evaluation and management service by telephone and the availability of in person appointments. I also discussed with the patient that there may be a patient responsible charge related to this service. The patient expressed understanding and agreed to proceed.   History of Present Illness: Pt finished the semester with all A's. Her ADHD is well controlled. It is not a problem since is out. It was rough to go from in person to online. She states the school was very supportive. She is still talking with her friends and they have been supportive thru the quarantine. She has been drawing, gardening and talking to friends to keep busy. Things at home are good but it was "rough for a while". Pt denies depression. She is feeling "lost in what I can do to help people right now". All the stressors that are going in the world are overwhelming. Her neighbors have been shooting guns and it makes her worry about her black neighbors. Overall her anxiety is manageable and has not been taking the Ativan. She is not having any social anxiety right now because she is reaching out and making new friends.  Pt denies insomnia. Pt denies SI/HI.  She is staying positive.   Observations/Objective:   Assessment and Plan:   ***MDD-recurrent, mild; GAD; Social anxiety disorder; ADHD-inattentive type; r/o Eating disorder  The risk of un-intended pregnancy is low based on the fact that pt reports she is using birth control. Pt is aware that these meds carry a teratogenic risk. Pt will discuss plan of action if she does or plans to become pregnant in the future.  Status of current symptoms: stable   Medication  management with supportive therapy. Risks/benefits and SE of the medication discussed. Pt verbalized understanding and verbal consent obtained for treatment.  Affirm with the patient that the medications are taken as ordered. Patient expressed understanding of how their medications were to be used.    Meds: Abilify 2mg  po qD for adjunct for mood Prozac 60mg  po qD for depression and anxiety  Dayatrana Patch 15mg  p qD for ADHD-she is tolerating the decreased dose well and it remains effective Ativan 0.5mg  po qD prn anxiety- no refill today, pt is not taking it Pt has been taking above medications without SE or AR   Follow Up Instructions:    I discussed the assessment and treatment plan with the patient. The patient was provided an opportunity to ask questions and all were answered. The patient agreed with the plan and demonstrated an understanding of the instructions.   The patient was advised to call back or seek an in-person evaluation if the symptoms worsen or if the condition fails to improve as anticipated.  I provided *** minutes of non-face-to-face time during this encounter.   Charlcie Cradle, MD

## 2019-06-12 ENCOUNTER — Other Ambulatory Visit: Payer: Self-pay

## 2019-06-12 DIAGNOSIS — Z20822 Contact with and (suspected) exposure to covid-19: Secondary | ICD-10-CM

## 2019-06-13 LAB — NOVEL CORONAVIRUS, NAA: SARS-CoV-2, NAA: NOT DETECTED

## 2019-06-29 MED FILL — MINOCYCLINE 50 MG CAPSULE: 50 | 30 days supply | Qty: 60 | Fill #0

## 2019-07-01 ENCOUNTER — Encounter (HOSPITAL_COMMUNITY): Payer: Self-pay | Admitting: Psychiatry

## 2019-07-01 ENCOUNTER — Ambulatory Visit (INDEPENDENT_AMBULATORY_CARE_PROVIDER_SITE_OTHER): Payer: No Typology Code available for payment source | Admitting: Psychiatry

## 2019-07-01 ENCOUNTER — Other Ambulatory Visit: Payer: Self-pay

## 2019-07-01 DIAGNOSIS — F330 Major depressive disorder, recurrent, mild: Secondary | ICD-10-CM | POA: Diagnosis not present

## 2019-07-01 DIAGNOSIS — F411 Generalized anxiety disorder: Secondary | ICD-10-CM

## 2019-07-01 DIAGNOSIS — F900 Attention-deficit hyperactivity disorder, predominantly inattentive type: Secondary | ICD-10-CM

## 2019-07-01 DIAGNOSIS — F4010 Social phobia, unspecified: Secondary | ICD-10-CM | POA: Diagnosis not present

## 2019-07-01 DIAGNOSIS — F332 Major depressive disorder, recurrent severe without psychotic features: Secondary | ICD-10-CM

## 2019-07-01 MED ORDER — METHYLPHENIDATE 15 MG/9HR TD PTCH: 15.0000 mg | MEDICATED_PATCH | Freq: Every day | TRANSDERMAL | 0 refills | Status: DC

## 2019-07-01 MED ORDER — ARIPIPRAZOLE 2 MG PO TABS: 2.0000 mg | ORAL_TABLET | Freq: Every day | ORAL | 0 refills | Status: DC

## 2019-07-01 MED ORDER — FLUOXETINE HCL 20 MG PO CAPS: 60.0000 mg | ORAL_CAPSULE | Freq: Every day | ORAL | 0 refills | Status: DC

## 2019-07-01 MED FILL — DAYTRANA 15 MG/9 HR PATCH: 15 | 30 days supply | Qty: 30 | Fill #0

## 2019-07-01 NOTE — Progress Notes (Signed)
Virtual Visit via Telephone Note  I connected with Susan Bauer on 07/01/19 at  9:30 AM EDT by telephone and verified that I am speaking with the correct person using two identifiers.  Location: Patient: home Provider: office   I discussed the limitations, risks, security and privacy concerns of performing an evaluation and management service by telephone and the availability of in person appointments. I also discussed with the patient that there may be a patient responsible charge related to this service. The patient expressed understanding and agreed to proceed.   History of Present Illness: "I am doing good". She has restarted classes remotely. She does a lab portion that requires her to be in person on Wednesdays for a couple of hrs. It is making her anxious due to COVID but she thinks she will be able to handle. Overall anxiety is "fine". Pt states she has not taken Ativan in months but thinks she might need it for school. Pt gets really depressed around the time of her period. She feels really down for 1-2 days then "snaps back". On those days she is sad at night about stressors. She will cry and then feels better. Pt denies SI/HI. Daytrana remains effective. She has been able to focus. She does feel concerned because it sometimes makes her feel "I am having a fever" and decreases her appetite. Pt is eating 3 meals/day and snacks. Portions are average "I am not starving myself or anything". She is working out 5 days for about 60 min. She is focusing on gaining muscle and toning. She started in late July. She has checked her temp when she feels "feverish" and it was 98.7.   Observations/Objective: I spoke with Susan Bauer on the phone.  Pt was calm, pleasant and cooperative.  Pt was engaged in the conversation and answered questions appropriately.  Speech was clear and coherent with normal rate, tone and volume.  Mood is euythmic, affect is full. Thought processes are coherent, goal oriented  and intact.  Thought content is logical.  Pt denies SI/HI.   Pt denies auditory and visual hallucinations and did not appear to be responding to internal stimuli.  Memory and concentration are good.  Fund of knowledge and use of language are average.  Insight and judgment are fair.  I am unable to comment on psychomotor activity, general appearance, hygiene, or eye contact as I was unable to physically see the patient on the phone.  Vital signs not available since interview conducted virtually.    Assessment and Plan: ADHD/inattentive type; GAD; social anxiety disorder; MDD-recurrent, mild; rule out eating disorder  Status of current symptoms; stable  Pt is no longer taking any birth control and is aware that her current meds can cause teratogenic effects to the fetus.   Continue- Abilify 2 mg p.o. daily for adjunct treatment of mood  Prozac 60 mg p.o. daily for depression and anxiety  Daytrana patch 15 mg daily for 9 hours for ADHD.  Ativan 0.5 mg p.o. daily PRN anxiety-no refill today as patient states she has not been taking it and has plenty of tablets if needed  Patient has been taking the above medications for many months without any reported side effects or adverse reactions  Follow Up Instructions: In 3 months or sooner if needed   I discussed the assessment and treatment plan with the patient. The patient was provided an opportunity to ask questions and all were answered. The patient agreed with the plan and demonstrated an  understanding of the instructions.   The patient was advised to call back or seek an in-person evaluation if the symptoms worsen or if the condition fails to improve as anticipated.  I provided 20 minutes of non-face-to-face time during this encounter.   Charlcie Cradle, MD

## 2019-09-06 MED FILL — FLUARIX QUADRIVALENT 0.5 ML: 0.5 | 1 days supply | Qty: 1 | Fill #0

## 2019-09-30 ENCOUNTER — Ambulatory Visit (INDEPENDENT_AMBULATORY_CARE_PROVIDER_SITE_OTHER): Payer: No Typology Code available for payment source | Admitting: Psychiatry

## 2019-09-30 ENCOUNTER — Other Ambulatory Visit: Payer: Self-pay

## 2019-09-30 DIAGNOSIS — Z5329 Procedure and treatment not carried out because of patient's decision for other reasons: Secondary | ICD-10-CM

## 2019-09-30 NOTE — Progress Notes (Signed)
I called Susan Bauer on her cell phone twice. There was no answer and I left voice messages. I called the home number and spoke to her mom. Mom told me to call the cell phone (I confirmed the number) and there was no answer.

## 2019-11-26 ENCOUNTER — Ambulatory Visit: Payer: No Typology Code available for payment source | Attending: Internal Medicine

## 2019-11-26 DIAGNOSIS — Z20822 Contact with and (suspected) exposure to covid-19: Secondary | ICD-10-CM

## 2019-11-27 LAB — NOVEL CORONAVIRUS, NAA: SARS-CoV-2, NAA: NOT DETECTED

## 2020-01-30 ENCOUNTER — Ambulatory Visit: Payer: No Typology Code available for payment source | Attending: Internal Medicine

## 2020-01-30 DIAGNOSIS — Z23 Encounter for immunization: Secondary | ICD-10-CM

## 2020-01-30 NOTE — Progress Notes (Signed)
   Covid-19 Vaccination Clinic  Name:  Susan Bauer    MRN: [id number] DOB: [birth year]  01/30/2020  Susan Bauer was observed post Covid-19 immunization for 30 minutes based on pre-vaccination screening without incident. She was provided with Vaccine Information Sheet and instruction to access the V-Safe system.   Susan Bauer was instructed to call 911 with any severe reactions post vaccine: Marland Kitchen Difficulty breathing  . Swelling of face and throat  . A fast heartbeat  . A bad rash all over body  . Dizziness and weakness   Immunizations Administered    Name Date Dose VIS Date Route   Pfizer COVID-19 Vaccine 01/30/2020 10:07 AM 0.3 mL 10/22/2019 Intramuscular   Manufacturer: [place]   Lot: [id number]   [place]: [id number]

## 2020-03-09 ENCOUNTER — Encounter (HOSPITAL_COMMUNITY): Payer: Self-pay | Admitting: Psychiatry

## 2020-03-09 ENCOUNTER — Telehealth (INDEPENDENT_AMBULATORY_CARE_PROVIDER_SITE_OTHER): Payer: No Typology Code available for payment source | Admitting: Psychiatry

## 2020-03-09 ENCOUNTER — Other Ambulatory Visit: Payer: Self-pay

## 2020-03-09 DIAGNOSIS — F411 Generalized anxiety disorder: Secondary | ICD-10-CM

## 2020-03-09 DIAGNOSIS — F4010 Social phobia, unspecified: Secondary | ICD-10-CM

## 2020-03-09 DIAGNOSIS — F900 Attention-deficit hyperactivity disorder, predominantly inattentive type: Secondary | ICD-10-CM

## 2020-03-09 DIAGNOSIS — F332 Major depressive disorder, recurrent severe without psychotic features: Secondary | ICD-10-CM

## 2020-03-09 MED ORDER — FLUOXETINE HCL 20 MG PO CAPS: 60.0000 mg | ORAL_CAPSULE | Freq: Every day | ORAL | 0 refills | Status: DC

## 2020-03-09 MED ORDER — DAYTRANA 20 MG/9HR TD PTCH: 1.0000 | MEDICATED_PATCH | Freq: Every day | TRANSDERMAL | 0 refills | Status: DC

## 2020-03-09 MED ORDER — ARIPIPRAZOLE 2 MG PO TABS: 2.0000 mg | ORAL_TABLET | Freq: Every day | ORAL | 0 refills | Status: DC

## 2020-03-09 MED ORDER — METHYLPHENIDATE 15 MG/9HR TD PTCH: 15.0000 mg | MEDICATED_PATCH | Freq: Every day | TRANSDERMAL | 0 refills | Status: DC

## 2020-03-09 MED FILL — FLUoxetine HCL 20 MG CAPS: 20 | 90 days supply | Qty: 270 | Fill #0

## 2020-03-09 MED FILL — ARIPiprazole 2 MG TABS: 2 | 90 days supply | Qty: 90 | Fill #0

## 2020-03-09 NOTE — BH Specialist Note (Signed)
Virtual Visit via Telephone Note  I connected with Susan Bauer on 03/09/20 at  3:30 PM EDT by telephone and verified that I am speaking with the correct person using two identifiers.  Location: Patient: home Provider: office   I discussed the limitations, risks, security and privacy concerns of performing an evaluation and management service by telephone and the availability of in person appointments. I also discussed with the patient that there may be a patient responsible charge related to this service. The patient expressed understanding and agreed to proceed.   History of Present Illness: Yolinda reports that her depression is worse for the last several weeks. She ran out of Prozac about 2 weeks ago. It is definitely contributing to the mood symptoms.2 days ago was the last time she experienced SI without plan or intent. She states it has been happening on/off and it scared her. She denies HI. She states her anxiety and social anxiety are well controlled. The Daytrana patch is not as effective and she would like to increase to 20mg .    Observations/Objective:  General Appearance: unable to assess  Eye Contact:  unable to assess  Speech:  Clear and Coherent and Normal Rate  Volume:  Normal  Mood:  Depressed  Affect:  Full Range  Thought Process:  Goal Directed, Linear and Descriptions of Associations: Intact  Orientation:  Full (Time, Place, and Person)  Thought Content:  Logical  Suicidal Thoughts:  Yes.  without intent/plan  Homicidal Thoughts:  No  Memory:  Immediate;   Good  Judgement:  Good  Insight:  Good  Psychomotor Activity: unable to assess  Concentration:  Concentration: Good  Recall:  Good  Fund of Knowledge:  Good  Language:  Good  Akathisia:  unable to assess  Handed:  Right  AIMS (if indicated):     Assets:  Communication Skills Desire for Improvement Financial Resources/Insurance Housing Leisure Time [place] Talents/Skills Transportation Vocational/Educational  ADL's:  unable to assess  Cognition:  WNL  Sleep:         I reviewed the information below on 03/09/2020 and have updated it. Assessment and Plan:  ADHD/inattentive type; GAD; social anxiety disorder; MDD-recurrent, mild; rule out eating disorder   Status of current symptoms- worsening depression   Pt is no longer taking any birth control and is aware that her current meds can cause teratogenic effects to the fetus.    Continue- Abilify 2 mg p.o. daily for adjunct treatment of mood   Prozac 60 mg p.o. daily for depression and anxiety   Increase Daytrana patch 20 mg daily for 9 hours for ADHD.   Ativan 0.5 mg p.o. daily PRN anxiety-no refill today as patient states she has not been taking it and has plenty of tablets if needed   Patient has been taking the above medications for many months without any reported side effects or adverse reactions  She has restarted therapy  Follow Up Instructions: In 2-3 months or sooner if needed   I discussed the assessment and treatment plan with the patient. The patient was provided an opportunity to ask questions and all were answered. The patient agreed with the plan and demonstrated an understanding of the instructions.   The patient was advised to call back or seek an in-person evaluation if the symptoms worsen or if the condition fails to improve as anticipated.  I provided 15 minutes of non-face-to-face time during this encounter.   Charlcie Cradle, MD

## 2020-03-15 ENCOUNTER — Telehealth (HOSPITAL_COMMUNITY): Payer: Self-pay

## 2020-03-15 NOTE — Telephone Encounter (Signed)
[place] 20MG  Karsten Fells PATCH  PATIENT HAS NOT TRIED & FAILED OTHER ALTERNATIVE MEDICATIONS

## 2020-03-29 ENCOUNTER — Telehealth (HOSPITAL_COMMUNITY): Payer: Self-pay | Admitting: *Deleted

## 2020-03-29 NOTE — Telephone Encounter (Signed)
Pt left VM stating that her pharmacy, [facility], is requesting some kind of clarification from MD on the Daytrana 15mg /9 hr patch. Writer attempted to speak with pharmacist but only option was to leave a message which this Probation officer did asking for a cal back. Call has not been returned.

## 2020-03-30 ENCOUNTER — Telehealth (HOSPITAL_COMMUNITY): Payer: Self-pay

## 2020-04-06 NOTE — Telephone Encounter (Signed)
I am not sure what step therapy is. Pt has been on this medication for years and is doing well. I do not want to switch her unnecessarily. Can we do an appeal?

## 2020-04-13 NOTE — Telephone Encounter (Signed)
Spoke with pharmacist and she stated that step therapy means that the patient needs to try other forms of Methylphenidate before insurance will cover [place]. The pharmacist stated that she can try other forms of the medication and if that doesn't work, I can do a PA

## 2020-04-17 ENCOUNTER — Other Ambulatory Visit (HOSPITAL_COMMUNITY): Payer: Self-pay

## 2020-04-25 ENCOUNTER — Telehealth (HOSPITAL_COMMUNITY): Payer: Self-pay

## 2020-04-25 NOTE — Telephone Encounter (Signed)
I received a call from the pharmacy regarding my message I sent you from 03/30/20. Please read the message and respond back accordingly. Please review the message. I need advisement so that I can contact and update the pharmacy. Thank you.

## 2020-04-27 NOTE — Telephone Encounter (Signed)
I have to talk to Susan Bauer about what she is wanting to do.

## 2020-04-28 NOTE — Telephone Encounter (Signed)
Ok thank you 

## 2020-05-11 ENCOUNTER — Ambulatory Visit (INDEPENDENT_AMBULATORY_CARE_PROVIDER_SITE_OTHER): Payer: No Typology Code available for payment source | Admitting: Family Medicine

## 2020-05-11 ENCOUNTER — Telehealth: Payer: No Typology Code available for payment source | Admitting: Family Medicine

## 2020-05-11 ENCOUNTER — Other Ambulatory Visit: Payer: Self-pay

## 2020-05-11 DIAGNOSIS — Z713 Dietary counseling and surveillance: Secondary | ICD-10-CM | POA: Diagnosis not present

## 2020-05-11 NOTE — Progress Notes (Signed)
Telehealth Encounter I connected with Susan Bauer (MRN [id number]) on 05/11/2020 by MyChart video-enabled, HIPAA-compliant telemedicine application, verified that I was speaking with the correct person using two identifiers, and that the patient was in a private environment conducive to confidentiality.  The patient agreed to proceed. Therapist Gardiner Sleeper, MS, LMFT, CEDS at [facility].    Provider was Kennith Center, PhD, RD, LDN, CEDRD Provider was located at [facility] during this telehealth encounter; patient was at home  Appt start time: 1330 end time: 1430 (1 hour)  Reason for telehealth visit: Referred by [facility] for Medical Nutrition Therapy related to disordered eating.    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  She has been seeing therapist Gardiner Sleeper for disordered eating and body image.  She wants to learn how to make good food choices and wants to improve her relationship with food.  Long history of restrictive/overeating food behaviors.   Assessment:  Usual eating pattern: 1 meals and 4-5 snacks per day.  "Not a breakfast person." Frequent foods and beverages: water, "easy" things such as chips and snack foods,  .   Avoided foods: soda, animal products, (does eat honey), grapefruit (disliked).  Vegan since early high school (2011/2012).   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  Would like to get back to exercise.   Sleep: Estimates average of at least 6 hours of sleep/night.  Feels she usually gets enough.  Naps 30 minutes up to 2 hours 3-4 X wk.   24-hr recall:  (Up at 7 AM) B (7:30 AM)-  1 c coffee, 3 tbsp vanilla oatmilk creamer Snk (10 AM)-  [employer] 16 oz agave lemonade, 1 oz Cheez-its (230 kcal) L (1 PM)-  ~1 c [employer] corn&blk bean quinoa bowl, 8 oz fruit-green tea (10 kcal) Snk (1:20 )-  1 c swt pot  crackers D (5 PM)-  1 1/2 c [employer] corn&blk bean quinoa bowl, water Snk (9 PM)-  1 c water, 8 oz fruit-green tea  Typical day? Yes.  Typical for a work day; eats a lot less on non-work days.    Intervention: Completed diet and exercise history, and established behavioral goals and method of documentation.    For recommendations and goals, see Patient Instructions.    Follow-up: Call to schedule an appt in September.    Morene Cecilio,JEANNIE

## 2020-05-11 NOTE — Patient Instructions (Addendum)
Consider taking the Living Well: Moving Good Intentions to Real Behavior Change., which meets 5:30 to 6:30 PM on Tuesdays, beginning July 13.  Search for [place]/classes, then search with the name of the series.    Keep in mind: We are designed to get pleasure from food.  At the same time, a very important concept is that TASTE PREFERENCES ARE LEARNED.  This means it will get easier to choose foods you know are good for you if you are exposed to them enough.    Behavioral Goals:  1. Eat at least 3 REAL meals and 1-2 snacks per day.  Eat breakfast within one hour of getting up.  Aim for no more than 5 hours between eating.   A REAL breakfast includes at least some protein and some starch, and vegetables and/or fruit.   A REAL lunch or dinner includes at least some protein, some starch, and vegetables and/or fruit.   (OR: Would you serve this to a guest in your home, and call it a meal?) - Making this happen will require PLANNING until you can establish routines, norms, and habits.   REAL meals offer nutritional balance that is more satisfying, making appetite management easier, as well as making it more likely that nutritional needs are met.    2. Exercise at least 30 minutes 3 times per week.       After 2 weeks, increase to 4 times a week.       By week 5, increase to 5 times a week.   3. Keep a consistent bedtime and getting up time.       Set an alarm to get up no later than 7 AM.       Turn off all screens no later than ____.       Establish a consistent bedtime routine.       Nap no more than 30 minutes on any day, to be completed by 3 PM.    - Document progress on your behavioral goals using the Goals Sheet provided today.    Follow-up: Call or email [email or web address]) to schedule an appt in September.

## 2020-05-18 ENCOUNTER — Telehealth (HOSPITAL_COMMUNITY): Payer: Self-pay | Admitting: Psychiatry

## 2020-05-18 ENCOUNTER — Telehealth (HOSPITAL_COMMUNITY): Payer: No Typology Code available for payment source | Admitting: Psychiatry

## 2020-05-18 ENCOUNTER — Other Ambulatory Visit: Payer: Self-pay

## 2020-05-18 NOTE — Telephone Encounter (Signed)
I called Gregary Signs at our scheduled appointment time today.  There was no answer.  I left a voice message asking her to call back when available.  I was unable to speak with her today.

## 2020-06-16 ENCOUNTER — Other Ambulatory Visit (HOSPITAL_COMMUNITY): Payer: Self-pay | Admitting: Psychiatry

## 2020-06-16 MED ORDER — DAYTRANA 20 MG/9HR TD PTCH: 1.0000 | MEDICATED_PATCH | Freq: Every day | TRANSDERMAL | 0 refills | Status: DC

## 2020-07-10 ENCOUNTER — Telehealth (HOSPITAL_COMMUNITY): Payer: Self-pay | Admitting: *Deleted

## 2020-07-10 MED FILL — DAYTRANA 20 MG/9 HOUR PATCH: 20 | 30 days supply | Qty: 30 | Fill #0

## 2020-07-10 NOTE — Telephone Encounter (Signed)
PA for Daytrana 20mg /9 hour patch approved from 07/10/20 to 07/09/21. PA reference # [id number].

## 2020-07-12 ENCOUNTER — Telehealth (HOSPITAL_COMMUNITY): Payer: Self-pay

## 2020-07-12 NOTE — Telephone Encounter (Signed)
MEDIMPACT PRESCRIPTION COVERAGE APPROVED  DAYTRANA (METHYLPHENIDATE) 20MG /9HR PATCH EFFECTIVE 07/10/2020 TO 07/09/2021  S/W VAA

## 2020-09-21 ENCOUNTER — Other Ambulatory Visit: Payer: Self-pay

## 2020-09-21 ENCOUNTER — Telehealth (HOSPITAL_COMMUNITY): Payer: No Typology Code available for payment source | Admitting: Psychiatry

## 2020-09-28 ENCOUNTER — Other Ambulatory Visit: Payer: Self-pay

## 2020-09-28 ENCOUNTER — Telehealth (HOSPITAL_COMMUNITY): Payer: No Typology Code available for payment source | Admitting: Psychiatry

## 2020-09-28 DIAGNOSIS — F411 Generalized anxiety disorder: Secondary | ICD-10-CM

## 2020-09-28 DIAGNOSIS — F332 Major depressive disorder, recurrent severe without psychotic features: Secondary | ICD-10-CM

## 2020-09-28 DIAGNOSIS — F4010 Social phobia, unspecified: Secondary | ICD-10-CM

## 2020-09-28 NOTE — Progress Notes (Unsigned)
Good  Quit school due to COVID-19. School not required vaccines for on campus classes. It was anxiety inducing bit she feels it was a good decision.  Now working at [employer]. She likes it and it feels rewarding.   Floy denies depression. Anxiety is a bigger problem to deal with. Anxiety is near constant. She is unable to relax. She becomes irritable and has racing thoughts. It gets really bad a few times a month. Meds are not helping. Concentration is better with higher dose of [place]. It helps to calm her anxiety dinner. Sleep is good. Jessenya is meeting with her eating disorder therapist once/ month due to cost. It has helped a lot. She is eating 3 meals and snacks per day. Artis is not checking her weight.   No birth control 20mg  daynatra Increase Abilify 4mg  Continue Prozac Needs refill Ativan 0.5mg  - takes 1/4th tab (58min)  Jan 27@ 9am  (15 min)

## 2020-09-29 ENCOUNTER — Telehealth (HOSPITAL_COMMUNITY): Payer: Self-pay | Admitting: *Deleted

## 2020-09-29 ENCOUNTER — Other Ambulatory Visit (HOSPITAL_COMMUNITY): Payer: Self-pay | Admitting: Psychiatry

## 2020-09-29 MED ORDER — FLUOXETINE HCL 20 MG PO CAPS: 60.0000 mg | ORAL_CAPSULE | Freq: Every day | ORAL | 0 refills | Status: DC

## 2020-09-29 MED ORDER — ARIPIPRAZOLE 2 MG PO TABS: 4.0000 mg | ORAL_TABLET | Freq: Every day | ORAL | 0 refills | Status: DC

## 2020-09-29 MED ORDER — LORAZEPAM 0.5 MG PO TABS: 0.2500 mg | ORAL_TABLET | Freq: Every day | ORAL | 0 refills | Status: DC | PRN

## 2020-09-29 MED ORDER — DAYTRANA 20 MG/9HR TD PTCH: 1.0000 | MEDICATED_PATCH | Freq: Every day | TRANSDERMAL | 0 refills | Status: DC

## 2020-09-29 MED FILL — LORazepam 0.5 MG TABS: 0.5 | 30 days supply | Qty: 15 | Fill #0

## 2020-09-29 MED FILL — DAYTRANA 20 MG/9 HOUR PATCH: 20 | 30 days supply | Qty: 30 | Fill #0

## 2020-09-29 MED FILL — FLUoxetine HCL 20 MG CAPS: 20 | 90 days supply | Qty: 270 | Fill #0

## 2020-09-29 NOTE — Telephone Encounter (Signed)
PA for Abilify 4mg  submitted to CoverMyMeds.

## 2020-10-04 ENCOUNTER — Telehealth (HOSPITAL_COMMUNITY): Payer: Self-pay | Admitting: *Deleted

## 2020-10-04 NOTE — Telephone Encounter (Signed)
PA for Abilify approved via [place]. Medication is approved through 10/02/21.

## 2020-10-06 MED FILL — ARIPiprazole 2 MG TABS: 2 | 90 days supply | Qty: 180 | Fill #0

## 2020-12-07 ENCOUNTER — Other Ambulatory Visit: Payer: Self-pay

## 2020-12-07 ENCOUNTER — Telehealth (INDEPENDENT_AMBULATORY_CARE_PROVIDER_SITE_OTHER): Payer: No Typology Code available for payment source | Admitting: Psychiatry

## 2020-12-07 ENCOUNTER — Other Ambulatory Visit (HOSPITAL_COMMUNITY): Payer: Self-pay | Admitting: Psychiatry

## 2020-12-07 DIAGNOSIS — F900 Attention-deficit hyperactivity disorder, predominantly inattentive type: Secondary | ICD-10-CM | POA: Diagnosis not present

## 2020-12-07 DIAGNOSIS — F4010 Social phobia, unspecified: Secondary | ICD-10-CM | POA: Diagnosis not present

## 2020-12-07 DIAGNOSIS — F411 Generalized anxiety disorder: Secondary | ICD-10-CM | POA: Diagnosis not present

## 2020-12-07 DIAGNOSIS — F332 Major depressive disorder, recurrent severe without psychotic features: Secondary | ICD-10-CM | POA: Diagnosis not present

## 2020-12-07 MED ORDER — DAYTRANA 20 MG/9HR TD PTCH: 1.0000 | MEDICATED_PATCH | Freq: Every day | TRANSDERMAL | 0 refills | Status: DC

## 2020-12-07 MED ORDER — FLUOXETINE HCL 20 MG PO CAPS: 60.0000 mg | ORAL_CAPSULE | Freq: Every day | ORAL | 0 refills | Status: DC

## 2020-12-07 MED ORDER — ARIPIPRAZOLE 2 MG PO TABS: 4.0000 mg | ORAL_TABLET | Freq: Every day | ORAL | 0 refills | Status: DC

## 2020-12-07 MED FILL — DAYTRANA 20 MG/9 HOUR PATCH: 20 | 30 days supply | Qty: 30 | Fill #0

## 2020-12-07 NOTE — Progress Notes (Signed)
Virtual Visit via Telephone Note  I connected with Susan Bauer on 12/07/20 at  9:05 AM EST by telephone and verified that I am speaking with the correct person using two identifiers.  Location: Patient: work Provider: office   I discussed the limitations, risks, security and privacy concerns of performing an evaluation and management service by telephone and the availability of in person appointments. I also discussed with the patient that there may be a patient responsible charge related to this service. The patient expressed understanding and agreed to proceed.   History of Present Illness: Susan Bauer states she is doing well. Her anxiety has decreased since increasing the dose of Abilify. Her anxiety episodes now occur with stressors but is manageable. Susan Bauer is doing well at work but does have some social anxiety at work. Outside of work her social anxiety is mild. She has only taken Ativan once since our last visit. Her depression is usually minor expect for around her menstrual cycle. During that time she has more intense depression but it last for a few days then her mood regulates. She is sleeping and eating well.    Observations/Objective:  General Appearance: unable to assess  Eye Contact:  unable to assess  Speech:  Clear and Coherent and Normal Rate  Volume:  Normal  Mood:  Euthymic  Affect:  Full Range  Thought Process:  Goal Directed, Linear and Descriptions of Associations: Intact  Orientation:  Full (Time, Place, and Person)  Thought Content:  Logical  Suicidal Thoughts:  No  Homicidal Thoughts:  No  Memory:  Immediate;   Good  Judgement:  Good  Insight:  Good  Psychomotor Activity: unable to assess  Concentration:  Concentration: Good  Recall:  Good  Fund of Knowledge:  Good  Language:  Good  Akathisia:  unable to assess  Handed:  Right  AIMS (if indicated):     Assets:  Communication Skills Desire for Improvement Financial Resources/Insurance Housing Leisure  Time Physical Health Resilience Social Support Talents/Skills Transportation Vocational/Educational  ADL's:  unable to assess  Cognition:  WNL  Sleep:         Assessment and Plan: 1. Major depressive disorder, recurrent, severe without psychotic features ([place]) - ARIPiprazole (ABILIFY) 2 MG tablet; Take 2 tablets (4 mg total) by mouth daily.  Dispense: 180 tablet; Refill: 0 - FLUoxetine (PROZAC) 20 MG capsule; Take 3 capsules (60 mg total) by mouth daily.  Dispense: 270 capsule; Refill: 0  2. Social anxiety disorder - FLUoxetine (PROZAC) 20 MG capsule; Take 3 capsules (60 mg total) by mouth daily.  Dispense: 270 capsule; Refill: 0  3. GAD (generalized anxiety disorder) - FLUoxetine (PROZAC) 20 MG capsule; Take 3 capsules (60 mg total) by mouth daily.  Dispense: 270 capsule; Refill: 0 - no refill on Ativan today as pt reports she has enough  4. Attention deficit hyperactivity disorder (ADHD), predominantly inattentive type - methylphenidate (DAYTRANA) 20 MG/9HR; Place 1 patch onto the skin daily. wear patch for 9 hours only each day  Dispense: 30 patch; Refill: 0 - methylphenidate (DAYTRANA) 20 MG/9HR; Place 1 patch onto the skin daily. wear patch for 9 hours only each day  Dispense: 30 patch; Refill: 0 - methylphenidate (DAYTRANA) 20 MG/9HR; Place 1 patch onto the skin daily. wear patch for 9 hours only each day  Dispense: 30 patch; Refill: 0     Follow Up Instructions: In 3 months or sooner if needed    I discussed the assessment and treatment plan with the  patient. The patient was provided an opportunity to ask questions and all were answered. The patient agreed with the plan and demonstrated an understanding of the instructions.   The patient was advised to call back or seek an in-person evaluation if the symptoms worsen or if the condition fails to improve as anticipated.  I provided 10 minutes of non-face-to-face time during this encounter.   Charlcie Cradle, MD

## 2021-03-01 ENCOUNTER — Telehealth (INDEPENDENT_AMBULATORY_CARE_PROVIDER_SITE_OTHER): Payer: No Typology Code available for payment source | Admitting: Psychiatry

## 2021-03-01 ENCOUNTER — Other Ambulatory Visit: Payer: Self-pay

## 2021-03-01 DIAGNOSIS — F900 Attention-deficit hyperactivity disorder, predominantly inattentive type: Secondary | ICD-10-CM | POA: Diagnosis not present

## 2021-03-01 DIAGNOSIS — F4010 Social phobia, unspecified: Secondary | ICD-10-CM | POA: Diagnosis not present

## 2021-03-01 DIAGNOSIS — F332 Major depressive disorder, recurrent severe without psychotic features: Secondary | ICD-10-CM

## 2021-03-01 DIAGNOSIS — F411 Generalized anxiety disorder: Secondary | ICD-10-CM

## 2021-03-01 MED ORDER — METHYLPHENIDATE 20 MG/9HR TD PTCH
1.0000 | MEDICATED_PATCH | Freq: Every day | TRANSDERMAL | 0 refills | Status: DC
  Filled 2021-03-01: qty 30, fill #0

## 2021-03-01 MED ORDER — ARIPIPRAZOLE 2 MG PO TABS
4.0000 mg | ORAL_TABLET | Freq: Every day | ORAL | 0 refills | Status: DC
  Filled 2021-03-01: qty 60, 30d supply, fill #0
  Filled 2021-03-05: qty 120, 60d supply, fill #0

## 2021-03-01 MED ORDER — FLUOXETINE HCL 20 MG PO CAPS
60.0000 mg | ORAL_CAPSULE | Freq: Every day | ORAL | 0 refills | Status: DC
  Filled 2021-03-01: qty 270, 90d supply, fill #0

## 2021-03-01 MED ORDER — METHYLPHENIDATE 20 MG/9HR TD PTCH
1.0000 | MEDICATED_PATCH | Freq: Every day | TRANSDERMAL | 0 refills | Status: DC
  Filled 2021-03-01 – 2021-05-01 (×2): qty 30, 30d supply, fill #0

## 2021-03-01 NOTE — Progress Notes (Signed)
Virtual Visit via Telephone Note  I connected with Susan Bauer on 03/01/21 at  4:30 PM EDT by telephone and verified that I am speaking with the correct person using two identifiers. We were not able to connect by video as Susan Bauer does not have a camera on her phone or laptop.   Location: Patient: home Provider: office   I discussed the limitations, risks, security and privacy concerns of performing an evaluation and management service by telephone and the availability of in person appointments. I also discussed with the patient that there may be a patient responsible charge related to this service. The patient expressed understanding and agreed to proceed.   History of Present Illness: Overall Susan Bauer states she is doing well. She states her depression is mild and manageable. She has a few bad days a month around her menstrual cycle. On bad days she is more tearful and down. She usually feels better quickly. Sleep and appetite are good. She denies SI/HI. Her anxiety is better since she has changed her job. 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 Her ADHD seems well controlled and she is using the patch when she works. The increase in the dose has helped.    Observations/Objective:  General Appearance: unable to assess  Eye Contact:  unable to assess  Speech:  Clear and Coherent and Normal Rate  Volume:  Normal  Mood:  Euthymic  Affect:  Full Range  Thought Process:  Goal Directed and Descriptions of Associations: Intact  Orientation:  Full (Time, Place, and Person)  Thought Content:  Logical  Suicidal Thoughts:  No  Homicidal Thoughts:  No  Memory:  Immediate;   Good  Judgement:  Good  Insight:  Good  Psychomotor Activity: unable to assess  Concentration:  Concentration: Good  Recall:  Good  Fund of Knowledge:  Good  Language:  Good  Akathisia:  unable to assess  Handed:  Right  AIMS (if indicated):     Assets:  Communication Skills Desire for  Improvement Financial Resources/Insurance Housing Leisure Time [place] Talents/Skills Transportation Vocational/Educational  ADL's:  unable to assess  Cognition:  WNL  Sleep:         Assessment and Plan: Depression screen Susan Bauer 2/9 03/01/2021  Decreased Interest 0  Down, Depressed, Hopeless 0  PHQ - 2 Score 0    Flowsheet Row Video Visit from 03/01/2021 in [place] No Risk     1. Major depressive disorder, recurrent, severe without psychotic features ([place]) - ARIPiprazole (ABILIFY) 2 MG tablet; TAKE 2 TABLETS (4 MG TOTAL) BY MOUTH DAILY.  Dispense: 180 tablet; Refill: 0 - FLUoxetine (PROZAC) 20 MG capsule; TAKE 3 CAPSULES (60 MG TOTAL) BY MOUTH DAILY.  Dispense: 270 capsule; Refill: 0  2. Social anxiety disorder - FLUoxetine (PROZAC) 20 MG capsule; TAKE 3 CAPSULES (60 MG TOTAL) BY MOUTH DAILY.  Dispense: 270 capsule; Refill: 0  3. GAD (generalized anxiety disorder) - FLUoxetine (PROZAC) 20 MG capsule; TAKE 3 CAPSULES (60 MG TOTAL) BY MOUTH DAILY.  Dispense: 270 capsule; Refill: 0  4. Attention deficit hyperactivity disorder (ADHD), predominantly inattentive type - methylphenidate (DAYTRANA) 20 MG/9HR; PLACE 1 PATCH ONTO THE SKIN DAILY. WEAR PATCH FOR 9 HOURS ONLY EACH DAY   FILL AFTER 28 DAYS  Dispense: 30 patch; Refill: 0 - methylphenidate (DAYTRANA) 20 MG/9HR; PLACE 1 PATCH ONTO THE SKIN DAILY. [place]  DAY (FILL AFTER 58 DAYS FROM 12/07/20)  Dispense: 30 patch; Refill: 0 - methylphenidate (DAYTRANA) 20 MG/9HR; PLACE 1 PATCH ONTO THE SKIN DAILY. WEAR PATCH FOR 9 HOURS ONLY EACH DAY  Dispense: 30 patch; Refill: 0    Follow Up Instructions: In 3 months or sooner if needed   I discussed the assessment and treatment plan with the patient. The patient was provided an opportunity to ask questions and all were answered. The patient agreed with the plan and  demonstrated an understanding of the instructions.   The patient was advised to call back or seek an in-person evaluation if the symptoms worsen or if the condition fails to improve as anticipated.  I provided 10 minutes of non-face-to-face time during this encounter.   Charlcie Cradle, MD

## 2021-03-02 ENCOUNTER — Other Ambulatory Visit (HOSPITAL_COMMUNITY): Payer: Self-pay

## 2021-03-05 ENCOUNTER — Other Ambulatory Visit (HOSPITAL_COMMUNITY): Payer: Self-pay

## 2021-03-06 ENCOUNTER — Other Ambulatory Visit (HOSPITAL_COMMUNITY): Payer: Self-pay

## 2021-03-13 ENCOUNTER — Other Ambulatory Visit (HOSPITAL_COMMUNITY): Payer: Self-pay

## 2021-05-01 ENCOUNTER — Other Ambulatory Visit (HOSPITAL_COMMUNITY): Payer: Self-pay

## 2021-05-21 ENCOUNTER — Other Ambulatory Visit (HOSPITAL_COMMUNITY): Payer: Self-pay

## 2021-05-24 ENCOUNTER — Telehealth (INDEPENDENT_AMBULATORY_CARE_PROVIDER_SITE_OTHER): Payer: No Typology Code available for payment source | Admitting: Psychiatry

## 2021-05-24 ENCOUNTER — Other Ambulatory Visit (HOSPITAL_COMMUNITY): Payer: Self-pay

## 2021-05-24 ENCOUNTER — Other Ambulatory Visit: Payer: Self-pay

## 2021-05-24 DIAGNOSIS — F332 Major depressive disorder, recurrent severe without psychotic features: Secondary | ICD-10-CM

## 2021-05-24 DIAGNOSIS — F411 Generalized anxiety disorder: Secondary | ICD-10-CM | POA: Diagnosis not present

## 2021-05-24 DIAGNOSIS — F4010 Social phobia, unspecified: Secondary | ICD-10-CM | POA: Diagnosis not present

## 2021-05-24 DIAGNOSIS — F900 Attention-deficit hyperactivity disorder, predominantly inattentive type: Secondary | ICD-10-CM

## 2021-05-24 MED ORDER — METHYLPHENIDATE 20 MG/9HR TD PTCH
1.0000 | MEDICATED_PATCH | Freq: Every day | TRANSDERMAL | 0 refills | Status: DC
  Filled 2021-05-24: qty 30, 30d supply, fill #0

## 2021-05-24 MED ORDER — FLUOXETINE HCL 20 MG PO CAPS
60.0000 mg | ORAL_CAPSULE | Freq: Every day | ORAL | 0 refills | Status: DC
  Filled 2021-05-24: qty 270, 90d supply, fill #0

## 2021-05-24 MED ORDER — ARIPIPRAZOLE 2 MG PO TABS
4.0000 mg | ORAL_TABLET | Freq: Every day | ORAL | 0 refills | Status: DC
  Filled 2021-05-24 (×2): qty 180, 90d supply, fill #0

## 2021-05-24 MED ORDER — LORAZEPAM 0.5 MG PO TABS
0.5000 mg | ORAL_TABLET | Freq: Every day | ORAL | 0 refills | Status: DC | PRN
  Filled 2021-05-24: qty 30, 30d supply, fill #0

## 2021-05-24 NOTE — Progress Notes (Signed)
Virtual Visit via Telephone Note  I connected with Susan Bauer on 05/24/21 at  2:00 PM EDT by telephone and verified that I am speaking with the correct person using two identifiers. We were unable to connect by video due to system error.   Location: Patient: in [place] on vacation. Provider: office   I discussed the limitations, risks, security and privacy concerns of performing an evaluation and management service by telephone and the availability of in person appointments. I also discussed with the patient that there may be a patient responsible charge related to this service. The patient expressed understanding and agreed to proceed.   History of Present Illness: Susan Bauer is doing well. She is currently on vacation in [place]. It has been nice. She shares that her depression is stable and mild. She has only had about 1 day of depression over the last 2 weeks. She denies anhedonia. Her sleep is good. Her anxiety is mild and situational. She denies hopelessness. She denies SI/HI. Her ADHD is well controlled and she only takes her stimulant on days she is working. She has noticed that her focus is good and she is productive. She notes a mild decrease in appetite but she does make sure she eats daily. She takes Ativan a handful of times in the last 6 months.    Observations/Objective:  General Appearance: unable to assess  Eye Contact:  unable to assess  Speech:  Clear and Coherent and Normal Rate  Volume:  Normal  Mood:  Euthymic  Affect:  Full Range  Thought Process:  Goal Directed, Linear, and Descriptions of Associations: Intact  Orientation:  Full (Time, Place, and Person)  Thought Content:  Logical  Suicidal Thoughts:  No  Homicidal Thoughts:  No  Memory:  Immediate;   Good  Judgement:  Good  Insight:  Good  Psychomotor Activity: unable to assess  Concentration:  Concentration: Good  Recall:  Good  Fund of Knowledge:  Good  Language:  Good  Akathisia:  unable to assess  Handed:  Right   AIMS (if indicated):     Assets:  Communication Skills Desire for Improvement Financial Resources/Insurance Housing Leisure Time [place] Talents/Skills Transportation Vocational/Educational  ADL's:  unable to assess  Cognition:  WNL  Sleep:        Assessment and Plan:  Depression screen [facility] [place] 2/9 05/24/2021 03/01/2021  Decreased Interest 0 0  Down, Depressed, Hopeless 0 0  PHQ - 2 Score 0 0    Flowsheet Row Video Visit from 05/24/2021 in [facility] Video Visit from 03/01/2021 in [place] No Risk No Risk      1. Major depressive disorder, recurrent, severe without psychotic features ([place]) - ARIPiprazole (ABILIFY) 2 MG tablet; Take 2 tablets (4 mg total) by mouth daily.  Dispense: 180 tablet; Refill: 0 - FLUoxetine (PROZAC) 20 MG capsule; Take 3 capsules (60 mg total) by mouth daily.  Dispense: 270 capsule; Refill: 0  2. Social anxiety disorder - FLUoxetine (PROZAC) 20 MG capsule; Take 3 capsules (60 mg total) by mouth daily.  Dispense: 270 capsule; Refill: 0 - LORazepam (ATIVAN) 0.5 MG tablet; Take 1 tablet (0.5 mg total) by mouth daily as needed for anxiety or sleep.  Dispense: 30 tablet; Refill: 0  3. GAD (generalized anxiety disorder) - FLUoxetine (PROZAC) 20 MG capsule; Take 3 capsules (60 mg total) by mouth daily.  Dispense: 270 capsule; Refill: 0 - LORazepam (ATIVAN) 0.5  MG tablet; Take 1 tablet (0.5 mg total) by mouth daily as needed for anxiety or sleep.  Dispense: 30 tablet; Refill: 0  4. Attention deficit hyperactivity disorder (ADHD), predominantly inattentive type - methylphenidate (DAYTRANA) 20 MG/9HR; PLACE 1 PATCH ONTO THE SKIN DAILY. WEAR PATCH FOR 9 HOURS ONLY EACH DAY. 06/21/21  Dispense: 30 patch; Refill: 0 - methylphenidate (DAYTRANA) 20 MG/9HR; PLACE 1 PATCH ONTO THE SKIN DAILY. WEAR PATCH FOR 9 HOURS ONLY EACH DAY   Dispense: 30 patch; Refill: 0 - methylphenidate (DAYTRANA) 20 MG/9HR; PLACE 1 PATCH ONTO THE SKIN DAILY. WEAR PATCH FOR 9 HOURS ONLY EACH DAY  Dispense: 30 patch; Refill: 0   Follow Up Instructions: In 3 months or sooner if needed   I discussed the assessment and treatment plan with the patient. The patient was provided an opportunity to ask questions and all were answered. The patient agreed with the plan and demonstrated an understanding of the instructions.   The patient was advised to call back or seek an in-person evaluation if the symptoms worsen or if the condition fails to improve as anticipated.  I provided 8 minutes of non-face-to-face time during this encounter.   Charlcie Cradle, MD

## 2021-05-25 ENCOUNTER — Other Ambulatory Visit (HOSPITAL_COMMUNITY): Payer: Self-pay

## 2021-08-16 ENCOUNTER — Telehealth (HOSPITAL_COMMUNITY): Payer: Self-pay | Admitting: Psychiatry

## 2021-08-16 ENCOUNTER — Other Ambulatory Visit: Payer: Self-pay

## 2021-08-16 NOTE — Telephone Encounter (Signed)
I called the patient at our scheduled appointment time. There was no answer. I left a voice message for patient to call the clinic back at their convinence.   

## 2021-08-20 ENCOUNTER — Telehealth (HOSPITAL_COMMUNITY): Payer: Self-pay | Admitting: Psychiatry

## 2022-01-24 ENCOUNTER — Encounter (HOSPITAL_COMMUNITY): Payer: Self-pay | Admitting: Psychiatry

## 2022-01-24 ENCOUNTER — Other Ambulatory Visit: Payer: Self-pay

## 2022-01-24 ENCOUNTER — Telehealth (HOSPITAL_BASED_OUTPATIENT_CLINIC_OR_DEPARTMENT_OTHER): Payer: No Typology Code available for payment source | Admitting: Psychiatry

## 2022-01-24 ENCOUNTER — Other Ambulatory Visit (HOSPITAL_COMMUNITY): Payer: Self-pay

## 2022-01-24 DIAGNOSIS — F4010 Social phobia, unspecified: Secondary | ICD-10-CM

## 2022-01-24 DIAGNOSIS — F411 Generalized anxiety disorder: Secondary | ICD-10-CM

## 2022-01-24 DIAGNOSIS — F900 Attention-deficit hyperactivity disorder, predominantly inattentive type: Secondary | ICD-10-CM

## 2022-01-24 DIAGNOSIS — F332 Major depressive disorder, recurrent severe without psychotic features: Secondary | ICD-10-CM | POA: Diagnosis not present

## 2022-01-24 MED ORDER — METHYLPHENIDATE 20 MG/9HR TD PTCH
1.0000 | MEDICATED_PATCH | Freq: Every day | TRANSDERMAL | 0 refills | Status: DC
  Filled 2022-01-24 – 2022-03-04 (×3): qty 30, 30d supply, fill #0

## 2022-01-24 MED ORDER — SERTRALINE HCL 50 MG PO TABS
50.0000 mg | ORAL_TABLET | Freq: Every day | ORAL | 0 refills | Status: DC
  Filled 2022-01-24: qty 30, 30d supply, fill #0

## 2022-01-24 MED ORDER — ARIPIPRAZOLE 2 MG PO TABS
4.0000 mg | ORAL_TABLET | Freq: Every day | ORAL | 0 refills | Status: DC
  Filled 2022-01-24: qty 180, 90d supply, fill #0

## 2022-01-24 NOTE — Progress Notes (Signed)
Virtual Visit via Video Note ? ?I connected with Susan Bauer on 01/24/22 at  4:45 PM EDT by a video enabled telemedicine application and verified that I am speaking with the correct person using two identifiers. ? ?Location: ?Patient: outside  ?Provider: office ?  ?I discussed the limitations of evaluation and management by telemedicine and the availability of in person appointments. The patient expressed understanding and agreed to proceed. ? ?History of Present Illness: ?Susan Bauer is now working for [employer] in [employer]. Her meds remain effective in controlling her anxiety and depression. She still has depression that occurs around her menstrual cycle. She becomes depressed, crying everyday and having on/off SI without plan or intent. Her anxiety significantly worsens during this time. The whole things is overwhelming and debilitating. The episodes last about 1 week and start a few days before her cycle starts. The rest of the month she is not depressed or as anxious. Her sleep, energy and appetite are good. Work is going well and she is able to focus and be productive. [name] stopped taking all her meds for a several months but noticed worsening mood and anxiety symptoms. She still has Prozac, Abilify, Ativan. She restarted these meds in January 2023.  ?  ?Observations/Objective: ?Psychiatric Specialty Exam: ?ROS  ?There were no vitals taken for this visit.There is no height or weight on file to calculate BMI.  ?General Appearance: Fairly Groomed and Neat  ?Eye Contact:  Good  ?Speech:  Clear and Coherent and Normal Rate  ?Volume:  Normal  ?Mood:  Euthymic  ?Affect:  Full Range  ?Thought Process:  Goal Directed, Linear, and Descriptions of Associations: Intact  ?Orientation:  Full (Time, Place, and Person)  ?Thought Content:  Logical  ?Suicidal Thoughts:  No  ?Homicidal Thoughts:  No  ?Memory:  Immediate;   Good  ?Judgement:  Good  ?Insight:  Good  ?Psychomotor Activity:  Normal  ?Concentration:  Concentration:  Good  ?Recall:  Good  ?Fund of Knowledge:  Good  ?Language:  Good  ?Akathisia:  No  ?Handed:  Right  ?AIMS (if indicated):     ?Assets:  Communication Skills ?Desire for Improvement ?Financial Resources/Insurance ?Housing ?Leisure Time ?Physical Health ?Resilience ?Social Support ?Talents/Skills ?Transportation ?Vocational/Educational  ?ADL's:  Intact  ?Cognition:  WNL  ?Sleep:     ? ? ? ?Assessment and Plan: ?- she has an appointment with her in May and will get labs and EKG done then.  ? ?- previous failed trials- Wellbutrin, Buspar  ? ?- d/c Prozac ? ?Start trial of Zoloft '50mg'$  po qD for mood and anxiety. She will start it in 2-3 weeks, right after her next expected menstrual cycle ends. ? ?- [name] is interested in learning more about [place]  ? ?1. Major depressive disorder, recurrent, severe without psychotic features ([place]) ?- ARIPiprazole (ABILIFY) 2 MG tablet; Take 2 tablets (4 mg total) by mouth daily.  Dispense: 180 tablet; Refill: 0 ?- sertraline (ZOLOFT) 50 MG tablet; Take 1 tablet (50 mg total) by mouth daily.  Dispense: 30 tablet; Refill: 0 ? ?2. Social anxiety disorder ?- sertraline (ZOLOFT) 50 MG tablet; Take 1 tablet (50 mg total) by mouth daily.  Dispense: 30 tablet; Refill: 0 ? ?3. GAD (generalized anxiety disorder) ?- sertraline (ZOLOFT) 50 MG tablet; Take 1 tablet (50 mg total) by mouth daily.  Dispense: 30 tablet; Refill: 0 ? ?4. Attention deficit hyperactivity disorder (ADHD), predominantly inattentive type ?- methylphenidate (DAYTRANA) 20 MG/9HR; PLACE 1 PATCH ONTO THE SKIN DAILY. [place]  FOR 9 HOURS ONLY EACH DAY  Dispense: 30 patch; Refill: 0 ? ? ? ?Follow Up Instructions: ?In 2-4 weeks or sooner if needed ?  ?I discussed the assessment and treatment plan with the patient. The patient was provided an opportunity to ask questions and all were answered. The patient agreed with the plan and demonstrated an understanding of the instructions. ?  ?The patient was advised to call back or seek an  in-person evaluation if the symptoms worsen or if the condition fails to improve as anticipated. ? ?I provided 18 minutes of non-face-to-face time during this encounter. ? ? ?Charlcie Cradle, MD ? ?

## 2022-01-28 ENCOUNTER — Telehealth (HOSPITAL_COMMUNITY): Payer: Self-pay

## 2022-01-28 NOTE — Telephone Encounter (Signed)
RECEIVED PA FROM [facility] FOR PT'S METHYLPHENIDATE [facility]) '20MG'$  /9HR PATCH ?PRESCRIPTION COVERAGE; MEDIMPACT ?SENT FOR REVIEW ON COVER MY MEDS ?

## 2022-02-04 ENCOUNTER — Telehealth (HOSPITAL_COMMUNITY): Payer: Self-pay | Admitting: *Deleted

## 2022-02-04 NOTE — Telephone Encounter (Signed)
PA FOR METHYLPHENIDATE '20MG'$ /9 HR PATCH APPROVED BY MEDIMPACT VIA COVERMYMEDS. ? ?PA REFERENCE # [id number], APPROVED FROM 02/01/22 THROUGH 02/01/23. ?

## 2022-02-28 ENCOUNTER — Other Ambulatory Visit (HOSPITAL_COMMUNITY): Payer: Self-pay

## 2022-02-28 ENCOUNTER — Telehealth (HOSPITAL_BASED_OUTPATIENT_CLINIC_OR_DEPARTMENT_OTHER): Payer: No Typology Code available for payment source | Admitting: Psychiatry

## 2022-02-28 DIAGNOSIS — F4010 Social phobia, unspecified: Secondary | ICD-10-CM | POA: Diagnosis not present

## 2022-02-28 DIAGNOSIS — F900 Attention-deficit hyperactivity disorder, predominantly inattentive type: Secondary | ICD-10-CM

## 2022-02-28 DIAGNOSIS — F411 Generalized anxiety disorder: Secondary | ICD-10-CM

## 2022-02-28 DIAGNOSIS — F332 Major depressive disorder, recurrent severe without psychotic features: Secondary | ICD-10-CM | POA: Diagnosis not present

## 2022-02-28 MED ORDER — SERTRALINE HCL 50 MG PO TABS
50.0000 mg | ORAL_TABLET | Freq: Every day | ORAL | 0 refills | Status: DC
  Filled 2022-02-28: qty 90, 90d supply, fill #0

## 2022-02-28 NOTE — Progress Notes (Signed)
Virtual Visit via Video Note ? ?I connected with Susan Bauer on 02/28/22 at  2:45 PM EDT by a video enabled telemedicine application and verified that I am speaking with the correct person using two identifiers. ? ?Location: ?Patient: home ?Provider: office ?  ?I discussed the limitations of evaluation and management by telemedicine and the availability of in person appointments. The patient expressed understanding and agreed to proceed. ? ?History of Present Illness: ?Susan Bauer started Zoloft about 2-2.5 weeks ago. She is tolerating it and denies SE. She is unable to tell if the Zoloft is effective yet. She has some stressful things going on with family but has managed well. The depression and anxiety are manageable. Her sleep is good and she is getting about 8 hrs/night. The Susan Bauer is effective but she is waiting on her insurance to approve it. ?  ?Observations/Objective: ?Psychiatric Specialty Exam: ?ROS  ?There were no vitals taken for this visit.There is no height or weight on file to calculate BMI.  ?General Appearance: Casual and Neat  ?Eye Contact:  Good  ?Speech:  Clear and Coherent and Normal Rate  ?Volume:  Normal  ?Mood:  Anxious and Depressed  ?Affect:  Full Range  ?Thought Process:  Goal Directed, Linear, and Descriptions of Associations: Intact  ?Orientation:  Full (Time, Place, and Person)  ?Thought Content:  Logical  ?Suicidal Thoughts:  No  ?Homicidal Thoughts:  No  ?Memory:  Immediate;   Good  ?Judgement:  Good  ?Insight:  Good  ?Psychomotor Activity:  Normal  ?Concentration:  Concentration: Good  ?Recall:  Good  ?Fund of Knowledge:  Good  ?Language:  Good  ?Akathisia:  No  ?Handed:  Right  ?AIMS (if indicated):     ?Assets:  Communication Skills ?Desire for Improvement ?Financial Resources/Insurance ?Housing ?Leisure Time ?Physical Health ?Resilience ?Social Support ?Talents/Skills ?Transportation ?Vocational/Educational  ?ADL's:  Intact  ?Cognition:  WNL  ?Sleep:     ? ? ? ?Assessment and  Plan: ? ? ?  02/28/2022  ?  3:02 PM 05/24/2021  ?  2:08 PM 03/01/2021  ?  4:44 PM  ?Depression screen PHQ 2/9  ?Decreased Interest 1 0 0  ?Down, Depressed, Hopeless 0 0 0  ?PHQ - 2 Score 1 0 0  ? ? ?Flowsheet Row Video Visit from 02/28/2022 in [facility] Video Visit from 05/24/2021 in [facility] Video Visit from 03/01/2021 in [facility]  ?C-SSRS RISK CATEGORY No Risk No Risk No Risk  ? ?  ? ? ? ?Status of current problems: stable ? ?Meds:  ?1. Major depressive disorder, recurrent, severe without psychotic features ([place]) ?- sertraline (ZOLOFT) 50 MG tablet; Take 1 tablet (50 mg total) by mouth daily.  Dispense: 90 tablet; Refill: 0 ? ?2. Social anxiety disorder ?- sertraline (ZOLOFT) 50 MG tablet; Take 1 tablet (50 mg total) by mouth daily.  Dispense: 90 tablet; Refill: 0 ? ?3. GAD (generalized anxiety disorder) ?- sertraline (ZOLOFT) 50 MG tablet; Take 1 tablet (50 mg total) by mouth daily.  Dispense: 90 tablet; Refill: 0 ? ?4. Attention deficit hyperactivity disorder (ADHD), predominantly inattentive type ? ? ?- continue Daytrana patch ?- continue Abilify ?- continue Ativan ?Therapy: brief supportive therapy provided. Collaboration of Care: Other none today ? ?Patient/Guardian was advised Release of Information must be obtained prior to any record release in order to collaborate their care with an outside provider. Patient/Guardian was advised if they have not already done so to contact the  registration department to sign all necessary forms in order for [place] to release information regarding their care.  ? ?Consent: Patient/Guardian gives verbal consent for treatment and assignment of benefits for services provided during this visit. Patient/Guardian expressed understanding and agreed to proceed.  ? ? ? ? ?Follow Up Instructions: ?Follow up in 1-2 months or sooner if needed ?  ? ?I discussed the  assessment and treatment plan with the patient. The patient was provided an opportunity to ask questions and all were answered. The patient agreed with the plan and demonstrated an understanding of the instructions. ?  ?The patient was advised to call back or seek an in-person evaluation if the symptoms worsen or if the condition fails to improve as anticipated. ? ?I provided 10 minutes of non-face-to-face time during this encounter. ? ? ?Charlcie Cradle, MD ? ?

## 2022-03-01 ENCOUNTER — Other Ambulatory Visit (HOSPITAL_COMMUNITY): Payer: Self-pay

## 2022-03-04 ENCOUNTER — Other Ambulatory Visit (HOSPITAL_COMMUNITY): Payer: Self-pay

## 2022-03-04 ENCOUNTER — Telehealth (HOSPITAL_COMMUNITY): Payer: Self-pay

## 2022-03-04 NOTE — Telephone Encounter (Signed)
Patient called regarding her Methylphenidate '20mg'$ Karsten Fells Patch. Writer s/w pharmacist at [facility] due to a PA being completed and approved on the medication. Pharmacist stated that they're ordering pt's Methylphenidate Patch today and it will be in tomorrow. Writer called pt to relay message. Pt didn't pick up so writer LVM ?

## 2022-03-05 ENCOUNTER — Other Ambulatory Visit (HOSPITAL_COMMUNITY): Payer: Self-pay

## 2022-04-25 ENCOUNTER — Telehealth (HOSPITAL_COMMUNITY): Payer: No Typology Code available for payment source | Admitting: Psychiatry

## 2022-05-16 ENCOUNTER — Telehealth (HOSPITAL_BASED_OUTPATIENT_CLINIC_OR_DEPARTMENT_OTHER): Payer: No Typology Code available for payment source | Admitting: Psychiatry

## 2022-05-16 ENCOUNTER — Encounter (HOSPITAL_COMMUNITY): Payer: Self-pay | Admitting: Psychiatry

## 2022-05-16 ENCOUNTER — Other Ambulatory Visit (HOSPITAL_COMMUNITY): Payer: Self-pay

## 2022-05-16 DIAGNOSIS — F4010 Social phobia, unspecified: Secondary | ICD-10-CM

## 2022-05-16 DIAGNOSIS — F332 Major depressive disorder, recurrent severe without psychotic features: Secondary | ICD-10-CM

## 2022-05-16 DIAGNOSIS — F900 Attention-deficit hyperactivity disorder, predominantly inattentive type: Secondary | ICD-10-CM

## 2022-05-16 DIAGNOSIS — F411 Generalized anxiety disorder: Secondary | ICD-10-CM

## 2022-05-16 MED ORDER — METHYLPHENIDATE 20 MG/9HR TD PTCH
1.0000 | MEDICATED_PATCH | Freq: Every day | TRANSDERMAL | 0 refills | Status: DC
  Filled 2022-05-16 (×2): qty 30, 30d supply, fill #0

## 2022-05-16 MED ORDER — METHYLPHENIDATE 20 MG/9HR TD PTCH
1.0000 | MEDICATED_PATCH | Freq: Every day | TRANSDERMAL | 0 refills | Status: DC
  Filled 2022-05-16: qty 30, 30d supply, fill #0

## 2022-05-16 MED ORDER — FLUOXETINE HCL 20 MG PO CAPS
20.0000 mg | ORAL_CAPSULE | Freq: Every day | ORAL | 0 refills | Status: DC
  Filled 2022-05-16: qty 90, 90d supply, fill #0

## 2022-05-16 MED ORDER — ARIPIPRAZOLE 5 MG PO TABS
5.0000 mg | ORAL_TABLET | Freq: Every day | ORAL | 0 refills | Status: DC
  Filled 2022-05-16: qty 90, 90d supply, fill #0

## 2022-05-16 NOTE — Progress Notes (Signed)
Virtual Visit via Video Note  I connected with Susan Bauer on 05/16/22 at  9:45 AM EDT by a video enabled telemedicine application and verified that I am speaking with the correct person using two identifiers.  Location: Patient: home Provider: office   I discussed the limitations of evaluation and management by telemedicine and the availability of in person appointments. The patient expressed understanding and agreed to proceed.  History of Present Illness: "I have been pretty good". Susan Bauer has had some stressors. She is starting classes at [employer]  in August 2023. She is a little nervous but excited. Susan Bauer plans to major in Biology.  Her anxiety has been "pretty bad lately". A lot of that has to do with work stress. They are short staffed and she is doing the work of 2 people and is overwhelmed. She expects this will improve once school starts and not working anymore. Susan Bauer is having ruminating thoughts and hyper focusing on things she can't change and the future. Her social anxiety is high due to the thoughts of starting school. Susan Bauer is older than most college kids and is not happy with her body right now. Susan Bauer does feel supported by her friends. Her sleep, appetite and energy are good. Her ADHD is well controlled at work with the patch. She noticed that her ADHD Is worse at home when trying to engage in creative activities. It could also be due to the anxiety. The Daytrana patch effect lasts from 7:30am- 5pm. It can last 1-2 hrs even after taking it off. The last time she felt depressed was the beginning of June when she was weighed at her PCP appointment. She has gained 30 lbs since 2019. That made her feel very depressed. She is doing better now. Susan Bauer denies SI/HI. She is taking Ativan when she can't sleep. Lately she has taken 2 Ativan in the last 1 month. Susan Bauer doesn't feel much benefit with Zolot and would prefer to start Prozac.    Observations/Objective: Psychiatric Specialty Exam: ROS   There were no vitals taken for this visit.There is no height or weight on file to calculate BMI.  General Appearance: Casual, Neat, and Well Groomed  Eye Contact:  Good  Speech:  Clear and Coherent and Normal Rate  Volume:  Normal  Mood:  Anxious  Affect:  Full Range  Thought Process:  Goal Directed, Linear, and Descriptions of Associations: Intact  Orientation:  Full (Time, Place, and Person)  Thought Content:  Logical  Suicidal Thoughts:  No  Homicidal Thoughts:  No  Memory:  Immediate;   Good  Judgement:  Good  Insight:  Good  Psychomotor Activity:  Normal  Concentration:  Concentration: Good  Recall:  Good  Fund of Knowledge:  Good  Language:  Good  Akathisia:  No  Handed:  Right  AIMS (if indicated):     Assets:  Communication Skills Desire for Improvement Financial Resources/Insurance Housing Resilience Social Support Talents/Skills Transportation Vocational/Educational  ADL's:  Intact  Cognition:  WNL  Sleep:        Assessment and Plan:     02/28/2022    3:02 PM 05/24/2021    2:08 PM 03/01/2021    4:44 PM  Depression screen PHQ 2/9  Decreased Interest 1 0 0  Down, Depressed, Hopeless 0 0 0  PHQ - 2 Score 1 0 0    Flowsheet Row Video Visit from 02/28/2022 in [facility] Video Visit from 05/24/2021 in [facility] Video  Visit from 03/01/2021 in [facility]  C-SSRS RISK CATEGORY No Risk No Risk No Risk         Status of current problems: worsening anxiety  Meds: increase Abilify to '5mg'$  po qD to target mood and anxiety  D/c Zoloft and start Prozac '20mg'$  po qD for mood and anxiety. She has had good effect from Prozac in the past.   There are no diagnoses linked to this encounter.    Labs: none    Therapy: brief supportive therapy provided. Discussed psychosocial stressors in detail.     Collaboration of Care: Other  none  Patient/Guardian was advised Release of Information must be obtained prior to any record release in order to collaborate their care with an outside provider. Patient/Guardian was advised if they have not already done so to contact the registration department to sign all necessary forms in order for [place] to release information regarding their care.   Consent: Patient/Guardian gives verbal consent for treatment and assignment of benefits for services provided during this visit. Patient/Guardian expressed understanding and agreed to proceed.     Follow Up Instructions: Follow up in 4-6 weeks or sooner if needed    I discussed the assessment and treatment plan with the patient. The patient was provided an opportunity to ask questions and all were answered. The patient agreed with the plan and demonstrated an understanding of the instructions.   The patient was advised to call back or seek an in-person evaluation if the symptoms worsen or if the condition fails to improve as anticipated.  I provided 19 minutes of non-face-to-face time during this encounter.   Charlcie Cradle, MD

## 2022-05-17 ENCOUNTER — Other Ambulatory Visit (HOSPITAL_COMMUNITY): Payer: Self-pay

## 2022-06-27 ENCOUNTER — Telehealth (HOSPITAL_COMMUNITY): Payer: No Typology Code available for payment source | Admitting: Psychiatry

## 2022-06-27 ENCOUNTER — Telehealth (HOSPITAL_COMMUNITY): Payer: Self-pay | Admitting: Psychiatry

## 2022-06-27 NOTE — Telephone Encounter (Signed)
Patient was not present on video platform used through [employer]. I called the patient at our scheduled appointment time. Each time I called it went straight to VM. I left a voice message for patient to call the clinic back at their convinence. There was no return phone call during out scheduled visit time. I was not able to speak with the patient today, as they were a no show for their scheduled appointment.

## 2022-12-19 ENCOUNTER — Other Ambulatory Visit (HOSPITAL_COMMUNITY): Payer: Self-pay

## 2022-12-19 ENCOUNTER — Telehealth (HOSPITAL_COMMUNITY): Payer: Self-pay | Admitting: Psychiatry

## 2022-12-19 ENCOUNTER — Other Ambulatory Visit (HOSPITAL_COMMUNITY): Payer: Self-pay | Admitting: *Deleted

## 2022-12-19 DIAGNOSIS — F411 Generalized anxiety disorder: Secondary | ICD-10-CM

## 2022-12-19 DIAGNOSIS — F332 Major depressive disorder, recurrent severe without psychotic features: Secondary | ICD-10-CM

## 2022-12-19 DIAGNOSIS — F4010 Social phobia, unspecified: Secondary | ICD-10-CM

## 2022-12-19 MED ORDER — ARIPIPRAZOLE 5 MG PO TABS
5.0000 mg | ORAL_TABLET | Freq: Every day | ORAL | 0 refills | Status: DC
  Filled 2022-12-19: qty 21, 21d supply, fill #0

## 2022-12-19 MED ORDER — FLUOXETINE HCL 20 MG PO CAPS
20.0000 mg | ORAL_CAPSULE | Freq: Every day | ORAL | 0 refills | Status: DC
  Filled 2022-12-19: qty 21, 21d supply, fill #0

## 2022-12-20 ENCOUNTER — Other Ambulatory Visit (HOSPITAL_COMMUNITY): Payer: Self-pay

## 2023-01-09 ENCOUNTER — Other Ambulatory Visit (HOSPITAL_COMMUNITY): Payer: Self-pay

## 2023-01-09 ENCOUNTER — Telehealth (HOSPITAL_BASED_OUTPATIENT_CLINIC_OR_DEPARTMENT_OTHER): Payer: BC Managed Care – PPO | Admitting: Psychiatry

## 2023-01-09 DIAGNOSIS — F4010 Social phobia, unspecified: Secondary | ICD-10-CM

## 2023-01-09 DIAGNOSIS — F411 Generalized anxiety disorder: Secondary | ICD-10-CM

## 2023-01-09 DIAGNOSIS — F332 Major depressive disorder, recurrent severe without psychotic features: Secondary | ICD-10-CM

## 2023-01-09 DIAGNOSIS — F900 Attention-deficit hyperactivity disorder, predominantly inattentive type: Secondary | ICD-10-CM | POA: Diagnosis not present

## 2023-01-09 MED ORDER — FLUOXETINE HCL 20 MG PO CAPS
20.0000 mg | ORAL_CAPSULE | Freq: Every day | ORAL | 0 refills | Status: DC
  Filled 2023-01-09: qty 90, 90d supply, fill #0

## 2023-01-09 MED ORDER — METHYLPHENIDATE 20 MG/9HR TD PTCH
1.0000 | MEDICATED_PATCH | Freq: Every day | TRANSDERMAL | 0 refills | Status: DC
  Filled 2023-01-09 – 2023-01-16 (×2): qty 30, 30d supply, fill #0

## 2023-01-09 MED ORDER — METHYLPHENIDATE 20 MG/9HR TD PTCH
1.0000 | MEDICATED_PATCH | Freq: Every day | TRANSDERMAL | 0 refills | Status: DC
  Filled 2023-01-09: qty 30, 30d supply, fill #0

## 2023-01-09 MED ORDER — ARIPIPRAZOLE 5 MG PO TABS
5.0000 mg | ORAL_TABLET | Freq: Every day | ORAL | 0 refills | Status: DC
  Filled 2023-01-09: qty 90, 90d supply, fill #0

## 2023-01-09 MED ORDER — LORAZEPAM 0.5 MG PO TABS
0.5000 mg | ORAL_TABLET | Freq: Every day | ORAL | 0 refills | Status: DC | PRN
  Filled 2023-01-09: qty 30, 30d supply, fill #0

## 2023-01-09 NOTE — Progress Notes (Signed)
Virtual Visit via Video Note  I connected with Susan Bauer on 01/09/23 at  3:45 PM EST by  a video enabled telemedicine application and verified that I am speaking with the correct person using two identifiers.  Location: Patient: home Provider: office   I discussed the limitations of evaluation and management by telemedicine and the availability of in person appointments. The patient expressed understanding and agreed to proceed.  History of Present Illness: Susan Bauer and I last met in July 2023. She restarted school at [employer] in August 2023. She is studying [place]. It is going well and she has 2 years left to go. She now has health insurance thru her school. She is not working now but plans to in the summer. Her overall anxiety is manageable. She states it is mostly due to school and not her home life.  At times the social anxiety spikes but she is able to work thru it. Susan Bauer has friends and was even able to confront one when there was a problem.  She is in therapy and that is helping. The ADHD has been a little bit of a struggle but she has been able to complete her work. Her grades are A's and B's. It is very hard for her start on assignments at a reasonable time. The depression has recently worsened over the beginning of February. On some bad days she skipped classes for 3 days. In the last 1 week it has improved and she is forcing herself to deal with it. Susan Bauer… has some anhedonia and low motivation. Sleep was poor but this week has improved and she is getting about 7 hrs. She was up at night with racing thoughts. Her energy was poor but is improving with better sleep. Susan Bauer reports small improvement in negative self talk. When she was really depressed she had some suicidal thoughts but no plan or intent. She thought about going to urgent care one day. This occurred for 1 day at the beginning of February. She denies SI/HI since that day. She restarted Prozac and Abilify around the middle of February.  She states it is helping and denies SE. It is helping and she wants to continue it. She ran out of Ativan a while ago. She wants to restart Daytrana patches.   Observations/Objective: Psychiatric Specialty Exam: ROS  There were no vitals taken for this visit.There is no height or weight on file to calculate BMI.  General Appearance: Neat and Well Groomed  Eye Contact:  Good  Speech:  Clear and Coherent and Normal Rate  Volume:  Normal  Mood:  Anxious and Depressed  Affect:  Full Range  Thought Process:  Goal Directed, Linear, and Descriptions of Associations: Intact  Orientation:  Full (Time, Place, and Person)  Thought Content:  Logical  Suicidal Thoughts:  No  Homicidal Thoughts:  No  Memory:  Immediate;   Good  Judgement:  Good  Insight:  Good  Psychomotor Activity:  Normal  Concentration:  Concentration: Good  Recall:  Good  Fund of Knowledge:  Good  Language:  Good  Akathisia:  No  Handed:  Right  AIMS (if indicated):     Assets:  Communication Skills Desire for Improvement Financial Resources/Insurance Housing Resilience Social Support Talents/Skills Transportation Vocational/Educational  ADL's:  Intact  Cognition:  WNL  Sleep:        Assessment and Plan:     01/09/2023    3:55 PM 05/16/2022   10:00 AM 02/28/2022    3:02 PM 05/24/2021  2:08 PM 03/01/2021    4:44 PM  Depression screen PHQ 2/9  Decreased Interest 1 0 1 0 0  Down, Depressed, Hopeless 2 0 0 0 0  PHQ - 2 Score 3 0 1 0 0  Altered sleeping 2      Tired, decreased energy 2      Change in appetite 0      Feeling bad or failure about yourself  2      Trouble concentrating 2      Moving slowly or fidgety/restless 0      Suicidal thoughts 0      PHQ-9 Score 11      Difficult doing work/chores Very difficult        Flowsheet Row Video Visit from 01/09/2023 in [facility] [facility] Video Visit from 05/16/2022 in [facility] [facility] Video  Visit from 02/28/2022 in [facility]  C-SSRS RISK CATEGORY No Risk No Risk No Risk          Pt is aware that these meds carry a teratogenic risk. Pt will discuss plan of action if she does or plans to become pregnant in the future.  Status of current problems: small improvement in depression and anxiety symptoms with 2 weeks use of Abilify and Prozac. ADHD is not well controlled.    Medication management with supportive therapy. Risks and benefits, side effects and alternative treatment options discussed with patient. Pt was given an opportunity to ask questions about medication, illness, and treatment. All current psychiatric medications have been reviewed and discussed with the patient and adjusted as clinically appropriate.  Pt verbalized understanding and verbal consent obtained for treatment.  Meds: restart Daytrana '20mg'$  po qD Restart Ativan 0.'5mg'$  po qD prn anxiety  1. Major depressive disorder, recurrent, severe without psychotic features ([place]) - ARIPiprazole (ABILIFY) 5 MG tablet; Take 1 tablet (5 mg total) by mouth daily.  Dispense: 90 tablet; Refill: 0 - FLUoxetine (PROZAC) 20 MG capsule; Take 1 capsule (20 mg total) by mouth daily.  Dispense: 90 capsule; Refill: 0  2. GAD (generalized anxiety disorder) - ARIPiprazole (ABILIFY) 5 MG tablet; Take 1 tablet (5 mg total) by mouth daily.  Dispense: 90 tablet; Refill: 0 - FLUoxetine (PROZAC) 20 MG capsule; Take 1 capsule (20 mg total) by mouth daily.  Dispense: 90 capsule; Refill: 0 - LORazepam (ATIVAN) 0.5 MG tablet; Take 1 tablet (0.5 mg total) by mouth daily as needed for anxiety or sleep.  Dispense: 30 tablet; Refill: 0  3. Social anxiety disorder - FLUoxetine (PROZAC) 20 MG capsule; Take 1 capsule (20 mg total) by mouth daily.  Dispense: 90 capsule; Refill: 0 - LORazepam (ATIVAN) 0.5 MG tablet; Take 1 tablet (0.5 mg total) by mouth daily as needed for anxiety or sleep.  Dispense: 30 tablet;  Refill: 0  4. Attention deficit hyperactivity disorder (ADHD), predominantly inattentive type - methylphenidate (DAYTRANA) 20 MG/9HR; PLACE 1 PATCH ONTO THE SKIN DAILY. [place]AR PATCH FOR 9 HOURS ONLY EACH DAY.  Dispense: 30 patch; Refill: 0 - methylphenidate (DAYTRANA) 20 MG/9HR; PLACE 1 PATCH ONTO THE SKIN DAILY. [place]AR PATCH FOR 9 HOURS ONLY EACH DAY  Dispense: 30 patch; Refill: 0 - methylphenidate (DAYTRANA) 20 MG/9HR; Place 1 patch onto the skin daily.  WEAR PATCH FOR 9 HOURS ONLY EACH DAY  Dispense: 30 patch; Refill: 0     Labs: labs at next visit    Therapy: brief supportive therapy provided. Discussed psychosocial stressors in detail.  Collaboration of Care: Other none  Patient/Guardian was advised Release of Information must be obtained prior to any record release in order to collaborate their care with an outside provider. Patient/Guardian was advised if they have not already done so to contact the registration department to sign all necessary forms in order for [place] to release information regarding their care.   Consent: Patient/Guardian gives verbal consent for treatment and assignment of benefits for services provided during this visit. Patient/Guardian expressed understanding and agreed to proceed.     Follow Up Instructions: Follow up in 2-3 months or sooner if needed    I discussed the assessment and treatment plan with the patient. The patient was provided an opportunity to ask questions and all were answered. The patient agreed with the plan and demonstrated an understanding of the instructions.   The patient was advised to call back or seek an in-person evaluation if the symptoms worsen or if the condition fails to improve as anticipated.  I provided 19 minutes of non-face-to-face time during this encounter.   Charlcie Cradle, MD

## 2023-01-10 ENCOUNTER — Other Ambulatory Visit (HOSPITAL_COMMUNITY): Payer: Self-pay

## 2023-01-16 ENCOUNTER — Telehealth (HOSPITAL_COMMUNITY): Payer: Self-pay | Admitting: *Deleted

## 2023-01-16 ENCOUNTER — Other Ambulatory Visit (HOSPITAL_COMMUNITY): Payer: Self-pay

## 2023-01-16 NOTE — Telephone Encounter (Signed)
PA FOR DAYTRANA 20 MG/9 HOURS PATCHES COMPLETED AND SUBMITTED TO BCBS  AWAITING DETERMINATION  COVERMYMEDS KEY: [id number]

## 2023-03-06 ENCOUNTER — Other Ambulatory Visit (HOSPITAL_COMMUNITY): Payer: Self-pay

## 2023-03-06 ENCOUNTER — Encounter (HOSPITAL_COMMUNITY): Payer: Self-pay

## 2023-03-06 ENCOUNTER — Telehealth (HOSPITAL_BASED_OUTPATIENT_CLINIC_OR_DEPARTMENT_OTHER): Payer: BC Managed Care – PPO | Admitting: Psychiatry

## 2023-03-06 DIAGNOSIS — F4010 Social phobia, unspecified: Secondary | ICD-10-CM | POA: Diagnosis not present

## 2023-03-06 DIAGNOSIS — F900 Attention-deficit hyperactivity disorder, predominantly inattentive type: Secondary | ICD-10-CM

## 2023-03-06 DIAGNOSIS — F411 Generalized anxiety disorder: Secondary | ICD-10-CM

## 2023-03-06 DIAGNOSIS — F332 Major depressive disorder, recurrent severe without psychotic features: Secondary | ICD-10-CM

## 2023-03-06 DIAGNOSIS — Z79899 Other long term (current) drug therapy: Secondary | ICD-10-CM

## 2023-03-06 MED ORDER — ARIPIPRAZOLE 5 MG PO TABS
5.0000 mg | ORAL_TABLET | Freq: Every day | ORAL | 0 refills | Status: DC
  Filled 2023-03-06: qty 90, 90d supply, fill #0

## 2023-03-06 MED ORDER — FLUOXETINE HCL 40 MG PO CAPS
40.0000 mg | ORAL_CAPSULE | Freq: Every day | ORAL | 2 refills | Status: DC
  Filled 2023-03-06: qty 30, 30d supply, fill #0

## 2023-03-06 MED ORDER — METHYLPHENIDATE 20 MG/9HR TD PTCH
1.0000 | MEDICATED_PATCH | Freq: Every day | TRANSDERMAL | 0 refills | Status: DC
Start: 2023-03-06 — End: 2023-05-08
  Filled 2023-03-06: qty 30, 30d supply, fill #0

## 2023-03-06 NOTE — Progress Notes (Signed)
Virtual Visit via Video Note  I connected with Susan Bauer on 03/06/23 at  3:30 PM EDT by a video enabled telemedicine application and verified that I am speaking with the correct person using two identifiers.  Location: Patient: home Provider: office   I discussed the limitations of evaluation and management by telemedicine and the availability of in person appointments. The patient expressed understanding and agreed to proceed.  History of Present Illness: Susan Bauer shares she has been "pretty busy". Her last class for the semester was yesterday. She had been focused on school and studying for finals. Her last final is on Monday. She only had to use the patch when she had a big assignment. Her focus was good in class without the stimulant. She uses the stimulant more at home. She wants to get a part time job this summer. She might move out but isn't sure yet. Susan Bauer also wants to get her driver's license this summer. She has already signed up for her fall semester. Her anxiety has been manageable despite all the stress from school. Most of her anxiety has been interpersonal but she is dealing with as it comes. Susan Bauer states her social anxiety is ok. She is going to try to join some clubs on campus to make friends. Susan Bauer has been very busy but has not noticed much depression. When she does notice it it is not as severe as when she was not on meds. The depression comes on 1-2x/week and lasted a few hours. She was able to move on quickly. She denies hopelessness. Her sleep is good. She has taken Ativan 2x in the past couple of months when she couldn't sleep. Her energy and appetite are good. Susan Bauer has had some negative self talk a few times. When they come she does things to make them stop. She denies SI/HI.     Observations/Objective: Psychiatric Specialty Exam: ROS  There were no vitals taken for this visit.There is no height or weight on file to calculate BMI.  General Appearance: Fairly Groomed and  Neat  Eye Contact:  Good  Speech:  Clear and Coherent and Normal Rate  Volume:  Normal  Mood:  Euthymic  Affect:  Full Range  Thought Process:  Goal Directed, Linear, and Descriptions of Associations: Intact  Orientation:  Full (Time, Place, and Person)  Thought Content:  Logical  Suicidal Thoughts:  No  Homicidal Thoughts:  No  Memory:  Immediate;   Good  Judgement:  Good  Insight:  Good  Psychomotor Activity:  Normal  Concentration:  Concentration: Good  Recall:  Good  Fund of Knowledge:  Good  Language:  Good  Akathisia:  No  Handed:  Right  AIMS (if indicated):     Assets:  Communication Skills Desire for Improvement Financial Resources/Insurance Housing Leisure Time Resilience Social Support Talents/Skills Transportation Vocational/Educational  ADL's:  Intact  Cognition:  WNL  Sleep:        Assessment and Plan:     03/06/2023    3:37 PM 01/09/2023    3:55 PM 05/16/2022   10:00 AM 02/28/2022    3:02 PM 05/24/2021    2:08 PM  Depression screen PHQ 2/9  Decreased Interest 0 1 0 1 0  Down, Depressed, Hopeless 1 2 0 0 0  PHQ - 2 Score 1 3 0 1 0  Altered sleeping 0 2     Tired, decreased energy 1 2     Change in appetite 0 0     Feeling  bad or failure about yourself  1 2     Trouble concentrating 0 2     Moving slowly or fidgety/restless 0 0     Suicidal thoughts 0 0     PHQ-9 Score 3 11     Difficult doing work/chores Somewhat difficult Very difficult       Flowsheet Row [facility]/06/2023 [facility] Video Visit from 01/09/2023 in [facility] Video Visit from 05/16/2022 in [facility]  C-SSRS RISK CATEGORY No Risk No Risk No Risk          Pt is aware that these meds carry a teratogenic risk. Pt will discuss plan of action if she does or plans to become pregnant in the future.  Status of current problems: Susan Bauer reports she has  been very busy but notes depression and anxiety are ongoing.    Medication management with supportive therapy. Risks and benefits, side effects and alternative treatment options discussed with patient. Pt was given an opportunity to ask questions about medication, illness, and treatment. All current psychiatric medications have been reviewed and discussed with the patient and adjusted as clinically appropriate.  Pt verbalized understanding and verbal consent obtained for treatment.  Meds: increase Prozac  po qD Continue Ativan Continue Daytrana patches for ADHD 1. GAD (generalized anxiety disorder) - ARIPiprazole (ABILIFY) 5 MG tablet; Take 1 tablet (5 mg total) by mouth daily.  Dispense: 90 tablet; Refill: 0  2. Major depressive disorder, recurrent, severe without psychotic features - ARIPiprazole (ABILIFY) 5 MG tablet; Take 1 tablet (5 mg total) by mouth daily.  Dispense: 90 tablet; Refill: 0  3. Social anxiety disorder  4. Attention deficit hyperactivity disorder (ADHD), predominantly inattentive type - methylphenidate (DAYTRANA) 20 MG/9HR; Place 1 patch onto the skin daily.  WEAR PATCH FOR 9 HOURS ONLY EACH DAY  Dispense: 30 patch; Refill: 0  5. Encounter for long-term (current) use of medications - CBC with Differential - Comprehensive Metabolic Panel (CMET) - Prolactin - Lipid panel - TSH    She will get EKG with her PCP   Therapy: brief supportive therapy provided. Discussed psychosocial stressors in detail.     Collaboration of Care: Other none  Patient/Guardian was advised Release of Information must be obtained prior to any record release in order to collaborate their care with an outside provider. Patient/Guardian was advised if they have not already done so to contact the registration department to sign all necessary forms in order for [place] to release information regarding their care.   Consent: Patient/Guardian gives verbal consent for treatment and assignment of  benefits for services provided during this visit. Patient/Guardian expressed understanding and agreed to proceed.       Follow Up Instructions: Follow up in 3 months or sooner if needed  Patient informed that I am leaving [facility] in 05/2023 and I relayed that they will be getting a new provider after that. Patient verbalized understanding and agreed with the plan.     I discussed the assessment and treatment plan with the patient. The patient was provided an opportunity to ask questions and all were answered. The patient agreed with the plan and demonstrated an understanding of the instructions.   The patient was advised to call back or seek an in-person evaluation if the symptoms worsen or if the condition fails to improve as anticipated.  I provided 17 minutes of non-face-to-face time during this encounter.   Oletta Darter, MD

## 2023-03-17 ENCOUNTER — Encounter (HOSPITAL_COMMUNITY): Payer: Self-pay

## 2023-05-08 ENCOUNTER — Other Ambulatory Visit (HOSPITAL_COMMUNITY): Payer: Self-pay

## 2023-05-08 ENCOUNTER — Telehealth (HOSPITAL_BASED_OUTPATIENT_CLINIC_OR_DEPARTMENT_OTHER): Payer: BC Managed Care – PPO | Admitting: Psychiatry

## 2023-05-08 DIAGNOSIS — Z79899 Other long term (current) drug therapy: Secondary | ICD-10-CM

## 2023-05-08 DIAGNOSIS — F900 Attention-deficit hyperactivity disorder, predominantly inattentive type: Secondary | ICD-10-CM | POA: Diagnosis not present

## 2023-05-08 DIAGNOSIS — F4010 Social phobia, unspecified: Secondary | ICD-10-CM

## 2023-05-08 DIAGNOSIS — F411 Generalized anxiety disorder: Secondary | ICD-10-CM

## 2023-05-08 DIAGNOSIS — F332 Major depressive disorder, recurrent severe without psychotic features: Secondary | ICD-10-CM

## 2023-05-08 MED ORDER — METHYLPHENIDATE 20 MG/9HR TD PTCH
1.0000 | MEDICATED_PATCH | Freq: Every day | TRANSDERMAL | 0 refills | Status: DC
Start: 2023-05-08 — End: 2024-01-01
  Filled 2023-05-08: qty 30, 30d supply, fill #0

## 2023-05-08 MED ORDER — ARIPIPRAZOLE 5 MG PO TABS
5.0000 mg | ORAL_TABLET | Freq: Every day | ORAL | 0 refills | Status: DC
Start: 2023-05-08 — End: 2024-01-01
  Filled 2023-05-08 – 2023-06-20 (×3): qty 90, 90d supply, fill #0

## 2023-05-08 MED ORDER — METHYLPHENIDATE 20 MG/9HR TD PTCH
1.0000 | MEDICATED_PATCH | Freq: Every day | TRANSDERMAL | 0 refills | Status: DC
Start: 2023-05-08 — End: 2024-01-01
  Filled 2023-05-08 – 2023-07-09 (×4): qty 30, 30d supply, fill #0

## 2023-05-08 MED ORDER — FLUOXETINE HCL 40 MG PO CAPS
40.0000 mg | ORAL_CAPSULE | Freq: Every day | ORAL | 0 refills | Status: DC
  Filled 2023-05-08 – 2023-06-20 (×3): qty 90, 90d supply, fill #0

## 2023-05-08 NOTE — Progress Notes (Signed)
Virtual Visit via Video Note  I connected with Susan Bauer on 05/08/23 at  2:30 PM EDT by a video enabled telemedicine application and verified that I am speaking with the correct person using two identifiers.  Location: Patient: home Provider: office   I discussed the limitations of evaluation and management by telemedicine and the availability of in person appointments. The patient expressed understanding and agreed to proceed.  History of Present Illness: Susan Bauer is doing well. Things have improved since she is on summer break. She has been keeping busy and has been social. She denies any issues with socializing. She goes out at least 1x/week. She talks online with friends around 1x/week. She has not really been experiencing any social anxiety. Susan Bauer is not working this summer and classes restart in August.  Her sleep schedule is a little off but is sleeping well. Her  appetite is good. Her energy is variable. She is able to focus and is being productive. The Daytrana patch is working well. If she forgets to take it she gets a headache. Susan Bauer denies depression and anhedonia. She is feeling better since Prozac was increased to 40mg . She denies SI/HI. She is dealing with stress better. She feels the meds are effective and wants to continue them. She denies SE.   Observations/Objective: Psychiatric Specialty Exam: ROS  There were no vitals taken for this visit.There is no height or weight on file to calculate BMI.  General Appearance: Casual and Fairly Groomed  Eye Contact:  Good  Speech:  Clear and Coherent and Normal Rate  Volume:  Normal  Mood:  Euthymic  Affect:  Full Range  Thought Process:  Goal Directed, Linear, and Descriptions of Associations: Intact  Orientation:  Full (Time, Place, and Person)  Thought Content:  Logical  Suicidal Thoughts:  No  Homicidal Thoughts:  No  Memory:  Immediate;   Good  Judgement:  Good  Insight:  Good  Psychomotor Activity:  Normal   Concentration:  Concentration: Good  Recall:  Good  Fund of Knowledge:  Good  Language:  Good  Akathisia:  No  Handed:  Right  AIMS (if indicated):     Assets:  Communication Skills Desire for Improvement Financial Resources/Insurance Housing Leisure Time Resilience Social Support Talents/Skills Transportation Vocational/Educational  ADL's:  Intact  Cognition:  WNL  Sleep:        Assessment and Plan:     05/08/2023    2:45 PM 03/06/2023    3:37 PM 01/09/2023    3:55 PM 05/16/2022   10:00 AM 02/28/2022    3:02 PM  Depression screen PHQ 2/9  Decreased Interest 0 0 1 0 1  Down, Depressed, Hopeless 0 1 2 0 0  PHQ - 2 Score 0 1 3 0 1  Altered sleeping  0 2    Tired, decreased energy  1 2    Change in appetite  0 0    Feeling bad or failure about yourself   1 2    Trouble concentrating  0 2    Moving slowly or fidgety/restless  0 0    Suicidal thoughts  0 0    PHQ-9 Score  3 11    Difficult doing work/chores  Somewhat difficult Very difficult      Flowsheet Row [facility] Visit [facility]08/20[facility] Video Visit from 03/06/2023 in [facility] Video Visit from 01/09/2023 in [facility]  C-SSRS RISK CATEGORY No Risk  No Risk No Risk          Pt is aware that these meds carry a teratogenic risk. Pt will discuss plan of action if she does or plans to become pregnant in the future.  Status of current problems: stable   Medication management with supportive therapy. Risks and benefits, side effects and alternative treatment options discussed with patient. Pt was given an opportunity to ask questions about medication, illness, and treatment. All current psychiatric medications have been reviewed and discussed with the patient and adjusted as clinically appropriate.  Pt verbalized understanding and verbal consent obtained for treatment.  Meds:  1. Attention  deficit hyperactivity disorder (ADHD), predominantly inattentive type - methylphenidate (DAYTRANA) 20 MG/9HR; PLACE 1 PATCH ONTO THE SKIN DAILY. WEAR PATCH FOR 9 HOURS ONLY EACH DAY  Dispense: 30 patch; Refill: 0 - methylphenidate (DAYTRANA) 20 MG/9HR; Place 1 patch onto the skin daily.  WEAR PATCH FOR 9 HOURS ONLY EACH DAY  Dispense: 30 patch; Refill: 0 - methylphenidate (DAYTRANA) 20 MG/9HR; PLACE 1 PATCH ONTO THE SKIN DAILY. WEAR PATCH FOR 9 HOURS ONLY EACH DAY.  Dispense: 30 patch; Refill: 0  2. Social anxiety disorder  3. GAD (generalized anxiety disorder) - ARIPiprazole (ABILIFY) 5 MG tablet; Take 1 tablet (5 mg total) by mouth daily.  Dispense: 90 tablet; Refill: 0  4. Major depressive disorder, recurrent, severe without psychotic features (HCC) - ARIPiprazole (ABILIFY) 5 MG tablet; Take 1 tablet (5 mg total) by mouth daily.  Dispense: 90 tablet; Refill: 0  5. Encounter for long-term (current) use of medications - CBC with Differential - Comprehensive Metabolic Panel (CMET) - TSH - HgB A1c - Lipid panel - Prolactin      Therapy: brief supportive therapy provided. Discussed psychosocial stressors in detail.     Collaboration of Care: Other none  Patient/Guardian was advised Release of Information must be obtained prior to any record release in order to collaborate their care with an outside provider. Patient/Guardian was advised if they have not already done so to contact the registration department to sign all necessary forms in order for [place] to release information regarding their care.   Consent: Patient/Guardian gives verbal consent for treatment and assignment of benefits for services provided during this visit. Patient/Guardian expressed understanding and agreed to proceed.       Follow Up Instructions: Follow up in 3 months or sooner if needed with a new psychiatrist  Patient informed that I am leaving [facility] in 05/2023 and I relayed that they will be getting a new  provider after that. Patient verbalized understanding and agreed with the plan.     I discussed the assessment and treatment plan with the patient. The patient was provided an opportunity to ask questions and all were answered. The patient agreed with the plan and demonstrated an understanding of the instructions.   The patient was advised to call back or seek an in-person evaluation if the symptoms worsen or if the condition fails to improve as anticipated.  I provided 10 minutes of non-face-to-face time during this encounter.   Oletta Darter, MD

## 2023-05-14 ENCOUNTER — Other Ambulatory Visit (HOSPITAL_COMMUNITY): Payer: Self-pay

## 2023-05-16 ENCOUNTER — Other Ambulatory Visit (HOSPITAL_COMMUNITY): Payer: Self-pay

## 2023-05-26 ENCOUNTER — Other Ambulatory Visit (HOSPITAL_COMMUNITY): Payer: Self-pay

## 2023-06-20 ENCOUNTER — Other Ambulatory Visit (HOSPITAL_COMMUNITY): Payer: Self-pay

## 2023-06-20 ENCOUNTER — Other Ambulatory Visit (HOSPITAL_BASED_OUTPATIENT_CLINIC_OR_DEPARTMENT_OTHER): Payer: Self-pay

## 2023-06-20 ENCOUNTER — Other Ambulatory Visit: Payer: Self-pay

## 2023-07-09 ENCOUNTER — Other Ambulatory Visit (HOSPITAL_COMMUNITY): Payer: Self-pay

## 2023-07-21 ENCOUNTER — Other Ambulatory Visit: Payer: Self-pay

## 2023-07-25 ENCOUNTER — Other Ambulatory Visit: Payer: Self-pay

## 2023-08-13 NOTE — Progress Notes (Unsigned)
Psychiatric Initial Adult Assessment   Patient Identification: Susan Bauer MRN:  [id number] Date of Evaluation:  08/13/2023 Referral Source: Dr. Michae Kava Chief Complaint:  No chief complaint on file.  Visit Diagnosis: No diagnosis found.   Assessment:  Susan Bauer is a 27 y.o. female with a history of ADHD inattentive type, GAD, MDD, and unspecified eating disorder who presents virtually to [facility] for initial evaluation on 08/13/2023 following transition from Dr. Michae Kava.  Patient reports ***  A number of assessments were performed during the evaluation today including PHQ-9 which they scored a *** on, GAD-7 which they scored a *** on, and [place] suicide severity screening which showed ***.  Based on these assessments patient would benefit from medication adjustment to better target their symptoms.  Plan: - Fluoxetine 40 mg daily - Abilify 5 mg daily - Daytrana 20 mg/9-hr - Crisis resources reviewed - Follow up in  History of Present Illness:  ***Susan Bauer was seen following transition from Dr. Carroll Sage is doing well. Things have improved since she is on summer break. She has been keeping busy and has been social. She denies any issues with socializing. She goes out at least 1x/week. She talks online with friends around 1x/week. She has not really been experiencing any social anxiety. Susan Bauer is not working this summer and classes restart in August.  Her sleep schedule is a little off but is sleeping well. Her  appetite is good. Her energy is variable. She is able to focus and is being productive. The Daytrana patch is working well. If she forgets to take it she gets a headache. [name] denies depression and anhedonia. She is feeling better since Prozac was increased to 40mg . She denies SI/HI. She is dealing with stress better. She feels the meds are effective and wants to continue them. She denies S   Associated Signs/Symptoms: Depression  Symptoms:  {DEPRESSION SYMPTOMS:20000} (Hypo) Manic Symptoms:  {BHH MANIC SYMPTOMS:22872} Anxiety Symptoms:  {BHH ANXIETY SYMPTOMS:22873} Psychotic Symptoms:  {BHH PSYCHOTIC SYMPTOMS:22874} PTSD Symptoms: {BHH PTSD [id number]:47829}  Past Psychiatric History: ***Patient had followed with Dr. Michae Kava since 2015.  Following with Dr. Lucianne Muss for 4 years prior to that.  Patient has been connected with various therapist throughout the years.  She denies any past psychiatric hospitalizations or suicide attempts.    Substance use  Previous Psychotropic Medications: Yes   Substance Abuse History in the last 12 months:  {yes no:314532}  Consequences of Substance Abuse: {BHH CONSEQUENCES OF SUBSTANCE ABUSE:22880}  Past Medical History:  Past Medical History:  Diagnosis Date   ADHD (attention deficit hyperactivity disorder)    Anxiety    Depression    Oppositional defiant disorder    Wears glasses     Past Surgical History:  Procedure Laterality Date   WISDOM TOOTH EXTRACTION      Family Psychiatric History: ***  Family History:  Family History  Problem Relation Age of Onset   Depression Mother     Social History:   Social History   Socioeconomic History   Marital status: Single    Spouse name: Not on file   Number of children: Not on file   Years of education: Not on file   Highest education level: Not on file  Occupational History   Not on file  Tobacco Use   Smoking status: Never   Smokeless tobacco: Never  Vaping Use   Vaping status: Never Used  Substance and Sexual Activity   Alcohol use:  Yes    Comment: Occasional beer   Drug use: No   Sexual activity: Never  Other Topics Concern   Not on file  Social History Narrative   Not on file   Social Determinants of Health   Financial Resource Strain: Not on file  Food Insecurity: Not on file  Transportation Needs: Not on file  Physical Activity: Not on file  Stress: Not on file  Social Connections: Not on file     Additional Social History: ***  Allergies:   Allergies  Allergen Reactions   Ritalin La [Methylphenidate Hcl Er (La)] Other (See Comments)    Tachycardia    Sulfa Antibiotics     Metabolic Disorder Labs: No results found for: "HGBA1C", "MPG" No results found for: "PROLACTIN" No results found for: "CHOL", "TRIG", "HDL", "CHOLHDL", "VLDL", "LDLCALC" No results found for: "TSH"  Therapeutic Level Labs: No results found for: "LITHIUM" No results found for: "CBMZ" No results found for: "VALPROATE"  Current Medications: Current Outpatient Medications  Medication Sig Dispense Refill   ARIPiprazole (ABILIFY) 5 MG tablet Take 1 tablet (5 mg total) by mouth daily. 90 tablet 0   FLUoxetine (PROZAC) 40 MG capsule Take 1 capsule (40 mg total) by mouth daily. 90 capsule 0   LORazepam (ATIVAN) 0.5 MG tablet Take 1 tablet (0.5 mg total) by mouth daily as needed for anxiety or sleep. 30 tablet 0   methylphenidate (DAYTRANA) 20 MG/9HR PLACE 1 PATCH ONTO THE SKIN DAILY. WEAR PATCH FOR 9 HOURS ONLY EACH DAY 30 patch 0   methylphenidate (DAYTRANA) 20 MG/9HR Place 1 patch onto the skin daily.  WEAR PATCH FOR 9 HOURS ONLY EACH DAY 30 patch 0   methylphenidate (DAYTRANA) 20 MG/9HR PLACE 1 PATCH ONTO THE SKIN DAILY. WEAR PATCH FOR 9 HOURS ONLY EACH DAY. 30 patch 0   norethindrone-ethinyl estradiol (JUNEL FE,GILDESS FE,LOESTRIN FE) 1-20 MG-MCG tablet Take 1 tablet by mouth daily.     No current facility-administered medications for this visit.    Psychiatric Specialty Exam: Review of Systems  There were no vitals taken for this visit.There is no height or weight on file to calculate BMI.  General Appearance: {Appearance:22683}  Eye Contact:  {BHH EYE CONTACT:22684}  Speech:  {Speech:22685}  Volume:  {Volume (PAA):22686}  Mood:  {BHH MOOD:22306}  Affect:  {Affect (PAA):22687}  Thought Process:  {Thought Process (PAA):22688}  Orientation:  {BHH ORIENTATION (PAA):22689}  Thought Content:   {Thought Content:22690}  Suicidal Thoughts:  {ST/HT (PAA):22692}  Homicidal Thoughts:  {ST/HT (PAA):22692}  Memory:  {BHH MEMORY:22881}  Judgement:  {Judgement (PAA):22694}  Insight:  {Insight (PAA):22695}  Psychomotor Activity:  {Psychomotor (PAA):22696}  Concentration:  {Concentration:21399}  Recall:  {BHH GOOD/FAIR/POOR:22877}  Fund of Knowledge:{BHH GOOD/FAIR/POOR:22877}  Language: {BHH GOOD/FAIR/POOR:22877}  Akathisia:  {BHH YES OR NO:22294}    AIMS (if indicated):  {Desc; done/not:10129}  Assets:  {Assets (PAA):22698}  ADL's:  {BHH XLK'G:40102}  Cognition: {chl bhh cognition:304700322}  Sleep:  {BHH GOOD/FAIR/POOR:22877}   Screenings: PHQ2-9    Flowsheet Row [facility] Visit [facility] 05/08/2023 in [facility] Video Visit from 03/06/2023 in [facility] Video Visit from 01/09/2023 in [facility] Video Visit from 05[facility]16/2022 in [facility] Video Visit from 02/28/2022 in [facility]  PHQ-2 Total Score 0 1 3 0 1  PHQ-9 Total Score -- 3 11 -- --      Flowsheet Row Video Visit [facility] 05/08/2023 in [facility]  Video Visit from 03/06/2023 in [facility] Video Visit from 01/09/2023 in [facility]  C-SSRS RISK CATEGORY No Risk No Risk No Risk        Collaboration of Care: Medication Management AEB *** and Psychiatrist AEB chart review  Patient/Guardian was advised Release of Information must be obtained prior to any record release in order to collaborate their care with an outside provider. Patient/Guardian was advised if they have not already done so to contact the registration department to sign all necessary forms in order for [place] to release information regarding their care.   Consent:  Patient/Guardian gives verbal consent for treatment and assignment of benefits for services provided during this visit. Patient/Guardian expressed understanding and agreed to proceed.   Stasia Cavalier, MD 10/2/20244:38 PM    Virtual Visit via Video Note  I connected with Iesha Summerhill on 08/13/23 at  1:00 PM EDT by a video enabled telemedicine application and verified that I am speaking with the correct person using two identifiers.  Location: Patient: Home Provider: Home office   I discussed the limitations of evaluation and management by telemedicine and the availability of in person appointments. The patient expressed understanding and agreed to proceed.   I discussed the assessment and treatment plan with the patient. The patient was provided an opportunity to ask questions and all were answered. The patient agreed with the plan and demonstrated an understanding of the instructions.   The patient was advised to call back or seek an in-person evaluation if the symptoms worsen or if the condition fails to improve as anticipated.  I provided *** minutes of non-face-to-face time during this encounter.   Stasia Cavalier, MD

## 2023-08-14 ENCOUNTER — Encounter (HOSPITAL_COMMUNITY): Payer: BC Managed Care – PPO | Admitting: Psychiatry

## 2023-08-14 ENCOUNTER — Encounter (HOSPITAL_COMMUNITY): Payer: Self-pay

## 2023-08-14 NOTE — Progress Notes (Signed)
This encounter was created in error - please disregard.

## 2023-11-24 NOTE — Progress Notes (Deleted)
 Psychiatric Initial Adult Assessment   Patient Identification: Susan Bauer MRN:  [id number] Date of Evaluation:  11/24/2023 Referral Source: Dr. Brutus Chief Complaint:  No chief complaint on file.  Visit Diagnosis: No diagnosis found.   Assessment:  Susan Bauer is a 28 y.o. female with a history of MDD, GAD, ADD, and unspecified eating disorder who presents virtually to [facility] at [facility] for initial evaluation on 11/24/2023 following transition from Dr. Brutus.  Patient reports ***  A number of assessments were performed during the evaluation today including PHQ-9 which they scored a *** on, GAD-7 which they scored a *** on, and [place] suicide severity screening which showed ***.    Plan: - Daytrana  20 mg/9hr patch - Prozac  40 mg daily - Abilify  5 mg daily - CBC, CMP, TSH, Vit D, lipid panel, and A1c ordered -  - Crisis resources reviewed - Follow up in  History of Present Illness:  ***   Susan Bauer is doing well. Things have improved since she is on summer break. She has been keeping busy and has been social. She denies any issues with socializing. She goes out at least 1x/week. She talks online with friends around 1x/week. She has not really been experiencing any social anxiety. Susan Bauer is not working this summer and classes restart in August.  Her sleep schedule is a little off but is sleeping well. Her  appetite is good. Her energy is variable. She is able to focus and is being productive. The Daytrana  patch is working well. If she forgets to take it she gets a headache. Susan Bauer denies depression and anhedonia. She is feeling better since Prozac  was increased to 40mg . She denies SI/HI. She is dealing with stress better. She feels the meds are effective and wants to continue them. She denies SE.   Associated Signs/Symptoms: Depression Symptoms:  {DEPRESSION SYMPTOMS:20000} (Hypo) Manic Symptoms:  {BHH MANIC SYMPTOMS:22872} Anxiety Symptoms:  {BHH  ANXIETY SYMPTOMS:22873} Psychotic Symptoms:  {BHH PSYCHOTIC SYMPTOMS:22874} PTSD Symptoms: {BHH PTSD SYMPTOMS:22875}  Past Psychiatric History: ***No prior psychiatric hospitalizations, patient does endorse a past suicide attempt Patient had followed with Dr. Von in the past before transitioning care to Dr. Brutus from 2016-2024.    Patient has tried zoloft , buproprion, and BuSpar  in the past.   Substance use:  Previous Psychotropic Medications: Yes   Substance Abuse History in the last 12 months:  {yes no:314532}  Consequences of Substance Abuse: NA  Past Medical History:  Past Medical History:  Diagnosis Date   ADHD (attention deficit hyperactivity disorder)    Anxiety    Depression    Oppositional defiant disorder    Wears glasses     Past Surgical History:  Procedure Laterality Date   WISDOM TOOTH EXTRACTION      Family Psychiatric History: As below  Family History:  Family History  Problem Relation Age of Onset   Depression Mother     Social History:   Social History   Socioeconomic History   Marital status: Single    Spouse name: Not on file   Number of children: Not on file   Years of education: Not on file   Highest education level: Not on file  Occupational History   Not on file  Tobacco Use   Smoking status: Never   Smokeless tobacco: Never  Vaping Use   Vaping status: Never Used  Substance and Sexual Activity   Alcohol use: Yes    Comment: Occasional beer   Drug use: No  Sexual activity: Never  Other Topics Concern   Not on file  Social History Narrative   Not on file   Social Drivers of Health   Financial Resource Strain: Not on file  Food Insecurity: Not on file  Transportation Needs: Not on file  Physical Activity: Not on file  Stress: Not on file  Social Connections: Not on file    Additional Social History: ***  Allergies:   Allergies  Allergen Reactions   Ritalin  La [Methylphenidate  Hcl Er (La)] Other (See  Comments)    Tachycardia    Sulfa Antibiotics     Metabolic Disorder Labs: No results found for: HGBA1C, MPG No results found for: PROLACTIN No results found for: CHOL, TRIG, HDL, CHOLHDL, VLDL, LDLCALC No results found for: TSH  Therapeutic Level Labs: No results found for: LITHIUM No results found for: CBMZ No results found for: VALPROATE  Current Medications: Current Outpatient Medications  Medication Sig Dispense Refill   ARIPiprazole  (ABILIFY ) 5 MG tablet Take 1 tablet (5 mg total) by mouth daily. 90 tablet 0   FLUoxetine  (PROZAC ) 40 MG capsule Take 1 capsule (40 mg total) by mouth daily. 90 capsule 0   LORazepam  (ATIVAN ) 0.5 MG tablet Take 1 tablet (0.5 mg total) by mouth daily as needed for anxiety or sleep. 30 tablet 0   methylphenidate  (DAYTRANA ) 20 MG/9HR PLACE 1 PATCH ONTO THE SKIN DAILY. WEAR PATCH FOR 9 HOURS ONLY EACH DAY 30 patch 0   methylphenidate  (DAYTRANA ) 20 MG/9HR Place 1 patch onto the skin daily.  WEAR PATCH FOR 9 HOURS ONLY EACH DAY 30 patch 0   methylphenidate  (DAYTRANA ) 20 MG/9HR PLACE 1 PATCH ONTO THE SKIN DAILY. WEAR PATCH FOR 9 HOURS ONLY EACH DAY. 30 patch 0   norethindrone-ethinyl estradiol (JUNEL FE,GILDESS FE,LOESTRIN FE) 1-20 MG-MCG tablet Take 1 tablet by mouth daily.     No current facility-administered medications for this visit.    Psychiatric Specialty Exam: Review of Systems  There were no vitals taken for this visit.There is no height or weight on file to calculate BMI.  General Appearance: {Appearance:22683}  Eye Contact:  {BHH EYE CONTACT:22684}  Speech:  {Speech:22685}  Volume:  {Volume (PAA):22686}  Mood:  {BHH MOOD:22306}  Affect:  {Affect (PAA):22687}  Thought Process:  {Thought Process (PAA):22688}  Orientation:  {BHH ORIENTATION (PAA):22689}  Thought Content:  {Thought Content:22690}  Suicidal Thoughts:  {ST/HT (PAA):22692}  Homicidal Thoughts:  {ST/HT (PAA):22692}  Memory:  {BHH MEMORY:22881}   Judgement:  {Judgement (PAA):22694}  Insight:  {Insight (PAA):22695}  Psychomotor Activity:  {Psychomotor (PAA):22696}  Concentration:  {Concentration:21399}  Recall:  {BHH GOOD/FAIR/POOR:22877}  Fund of Knowledge:{BHH GOOD/FAIR/POOR:22877}  Language: {BHH GOOD/FAIR/POOR:22877}  Akathisia:  {BHH YES OR NO:22294}    AIMS (if indicated):  {Desc; done/not:10129}  Assets:  {Assets (PAA):22698}  ADL's:  {BHH JIO'D:77709}  Cognition: {chl bhh cognition:304700322}  Sleep:  {BHH GOOD/FAIR/POOR:22877}   Screenings: PHQ2-9    Flowsheet Row [facility] Video Visit from 03/06/2023 in [facility] Video Visit from 01/09/2023 in [facility] Video Visit from 05/16/2022 in [facility] Video Visit from 02/28/2022 in [facility]  PHQ-2 Total Score 0 1 3 0 1  PHQ-9 Total Score -- 3 11 -- --      Flowsheet Row Video Visit [facility]/08/2023 in [facility] Video Visit from [facility]/06/2023 in [facility] Video Visit from 01/09/2023  in [facility]  C-SSRS RISK CATEGORY No Risk No Risk No Risk        Collaboration of Care: {BH OP Collaboration of Care:21014065}  Patient/Guardian was advised Release of Information must be obtained prior to any record release in order to collaborate their care with an outside provider. Patient/Guardian was advised if they have not already done so to contact the registration department to sign all necessary forms in order for us  to release information regarding their care.   Consent: Patient/Guardian gives verbal consent for treatment and assignment of benefits for services provided during this visit. Patient/Guardian expressed understanding and  agreed to proceed.   Arvella CHRISTELLA Finder, MD 1/13/20258:54 AM    Virtual Visit via Video Note  I connected with Susan Bauer on 11/24/23 at  1:00 PM EST by a video enabled telemedicine application and verified that I am speaking with the correct person using two identifiers.  Location: Patient: Home Provider: Home office   I discussed the limitations of evaluation and management by telemedicine and the availability of in person appointments. The patient expressed understanding and agreed to proceed.   I discussed the assessment and treatment plan with the patient. The patient was provided an opportunity to ask questions and all were answered. The patient agreed with the plan and demonstrated an understanding of the instructions.   The patient was advised to call back or seek an in-person evaluation if the symptoms worsen or if the condition fails to improve as anticipated.  I provided *** minutes of non-face-to-face time during this encounter.   Arvella CHRISTELLA Finder, MD

## 2023-11-25 ENCOUNTER — Telehealth (HOSPITAL_COMMUNITY): Payer: BC Managed Care – PPO | Admitting: Psychiatry
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
## 2024-01-01 ENCOUNTER — Telehealth (HOSPITAL_COMMUNITY): Payer: Self-pay | Admitting: *Deleted

## 2024-01-01 ENCOUNTER — Telehealth (HOSPITAL_COMMUNITY): Payer: Commercial Managed Care - HMO | Admitting: Psychiatry

## 2024-01-01 ENCOUNTER — Encounter (HOSPITAL_COMMUNITY): Payer: Self-pay | Admitting: Psychiatry

## 2024-01-01 ENCOUNTER — Other Ambulatory Visit (HOSPITAL_COMMUNITY): Payer: Self-pay

## 2024-01-01 DIAGNOSIS — F411 Generalized anxiety disorder: Secondary | ICD-10-CM | POA: Diagnosis not present

## 2024-01-01 DIAGNOSIS — F332 Major depressive disorder, recurrent severe without psychotic features: Secondary | ICD-10-CM | POA: Diagnosis not present

## 2024-01-01 DIAGNOSIS — Z79899 Other long term (current) drug therapy: Secondary | ICD-10-CM

## 2024-01-01 DIAGNOSIS — F4310 Post-traumatic stress disorder, unspecified: Secondary | ICD-10-CM

## 2024-01-01 MED ORDER — PROPRANOLOL HCL 10 MG PO TABS
10.0000 mg | ORAL_TABLET | Freq: Two times a day (BID) | ORAL | 2 refills | Status: DC | PRN
Start: 2024-01-01 — End: 2024-07-30
  Filled 2024-01-01: qty 60, 30d supply, fill #0

## 2024-01-01 MED ORDER — FLUOXETINE HCL 20 MG PO CAPS
20.0000 mg | ORAL_CAPSULE | Freq: Every day | ORAL | 0 refills | Status: DC
Start: 2024-01-01 — End: 2024-02-05
  Filled 2024-01-01: qty 14, 14d supply, fill #0

## 2024-01-01 MED ORDER — ARIPIPRAZOLE 5 MG PO TABS
5.0000 mg | ORAL_TABLET | Freq: Every day | ORAL | 0 refills | Status: DC
Start: 2024-01-01 — End: 2024-07-30
  Filled 2024-01-01: qty 90, 90d supply, fill #0

## 2024-01-01 MED ORDER — FLUOXETINE HCL 40 MG PO CAPS
40.0000 mg | ORAL_CAPSULE | Freq: Every day | ORAL | 0 refills | Status: DC
Start: 2024-01-01 — End: 2024-07-30
  Filled 2024-01-01: qty 90, 90d supply, fill #0

## 2024-01-01 NOTE — Telephone Encounter (Signed)
Referrals for ADHD testing/eval faxed to [facility], [facility], and [facility].

## 2024-01-15 ENCOUNTER — Ambulatory Visit (HOSPITAL_BASED_OUTPATIENT_CLINIC_OR_DEPARTMENT_OTHER): Payer: BC Managed Care – PPO

## 2024-01-15 DIAGNOSIS — Z79899 Other long term (current) drug therapy: Secondary | ICD-10-CM | POA: Diagnosis not present

## 2024-01-15 NOTE — Progress Notes (Signed)
 Patient arrived for her due labs. Labs were drawn from the right [facility]. Patient tolerated well and without complaint.

## 2024-01-16 LAB — CBC WITH DIFFERENTIAL/PLATELET
Basophils Absolute: 0 10*3/uL (ref 0.0–0.2)
Basos: 0 %
EOS (ABSOLUTE): 0.1 10*3/uL (ref 0.0–0.4)
Eos: 1 %
Hematocrit: 40.3 % (ref 34.0–46.6)
Hemoglobin: 13.5 g/dL (ref 11.1–15.9)
Immature Grans (Abs): 0 10*3/uL (ref 0.0–0.1)
Immature Granulocytes: 0 %
Lymphocytes Absolute: 1.9 10*3/uL (ref 0.7–3.1)
Lymphs: 29 %
MCH: 30.1 pg (ref 26.6–33.0)
MCHC: 33.5 g/dL (ref 31.5–35.7)
MCV: 90 fL (ref 79–97)
Monocytes Absolute: 0.5 10*3/uL (ref 0.1–0.9)
Monocytes: 7 %
Neutrophils Absolute: 4 10*3/uL (ref 1.4–7.0)
Neutrophils: 63 %
Platelets: 310 10*3/uL (ref 150–450)
RBC: 4.48 x10E6/uL (ref 3.77–5.28)
RDW: 11.9 % (ref 11.7–15.4)
WBC: 6.5 10*3/uL (ref 3.4–10.8)

## 2024-01-16 LAB — CMP14+EGFR
ALT: 47 IU/L — ABNORMAL HIGH (ref 0–32)
AST: 33 IU/L (ref 0–40)
Albumin: 4.6 g/dL (ref 4.0–5.0)
Alkaline Phosphatase: 66 IU/L (ref 44–121)
BUN/Creatinine Ratio: 16 (ref 9–23)
BUN: 9 mg/dL (ref 6–20)
Bilirubin Total: 0.4 mg/dL (ref 0.0–1.2)
CO2: 23 mmol/L (ref 20–29)
Calcium: 9.6 mg/dL (ref 8.7–10.2)
Chloride: 102 mmol/L (ref 96–106)
Creatinine, Ser: 0.55 mg/dL — ABNORMAL LOW (ref 0.57–1.00)
Globulin, Total: 2.7 g/dL (ref 1.5–4.5)
Glucose: 95 mg/dL (ref 70–99)
Potassium: 4.4 mmol/L (ref 3.5–5.2)
Sodium: 140 mmol/L (ref 134–144)
Total Protein: 7.3 g/dL (ref 6.0–8.5)
eGFR: 128 mL/min/{1.73_m2} (ref >59)

## 2024-01-16 LAB — HEMOGLOBIN A1C
Est. average glucose Bld gHb Est-mCnc: 105 mg/dL
Hgb A1c MFr Bld: 5.3 % (ref 4.8–5.6)

## 2024-01-16 LAB — LIPID PANEL
Chol/HDL Ratio: 3.3 ratio (ref 0.0–4.4)
Cholesterol, Total: 221 mg/dL — ABNORMAL HIGH (ref 100–199)
HDL: 68 mg/dL (ref >39)
LDL Chol Calc (NIH): 135 mg/dL — ABNORMAL HIGH (ref 0–99)
Triglycerides: 100 mg/dL (ref 0–149)
VLDL Cholesterol Cal: 18 mg/dL (ref 5–40)

## 2024-01-16 LAB — THYROID PANEL WITH TSH
Free Thyroxine Index: 1.7 (ref 1.2–4.9)
T3 Uptake Ratio: 21 % — ABNORMAL LOW (ref 24–39)
T4, Total: 8 ug/dL (ref 4.5–12.0)
TSH: 1.11 u[IU]/mL (ref 0.450–4.500)

## 2024-01-16 LAB — VITAMIN D 25 HYDROXY (VIT D DEFICIENCY, FRACTURES): Vit D, 25-Hydroxy: 24.2 ng/mL — ABNORMAL LOW (ref 30.0–100.0)

## 2024-02-04 NOTE — Progress Notes (Unsigned)
 BH MD/PA/NP OP Progress Note  02/05/2024 4:32 PM Susan Bauer  MRN:  [id number]  Visit Diagnosis:    ICD-10-CM   1. GAD (generalized anxiety disorder)  F41.1 FLUoxetine (PROZAC) 20 MG capsule    2. Major depressive disorder, recurrent, severe without psychotic features (HCC)  F33.2 FLUoxetine (PROZAC) 20 MG capsule    3. PTSD (post-traumatic stress disorder)  F43.10 FLUoxetine (PROZAC) 20 MG capsule      Assessment: Susan Bauer is a 28 y.o. female with a history of MDD, GAD, social anxiety disorder, and eating disorder unspecified who presents virtually to [facility] at [facility] for initial evaluation on 01/01/2024.  At initial evaluation patient reported neurovegetative terms of depression including low mood, anhedonia, amotivation, fatigue, negative self thoughts, and passive SI without intent or plan.  She also endorsed symptoms of significant anxiety and PTSD including excessive worry that she has difficult controlling, increased irritability, intrusive thoughts, flashbacks, avoidance behaviors, and hypervigilance.  In the past patient has struggled with eating disorder and purging behaviors however denies that in several years.  She denies any psychosis, mania, paranoia, or delusions.  Crisis resources were reviewed.  Patient met criteria for MDD, GAD, and PTSD.  Susan Bauer presents for follow-up evaluation. Today, 02/05/24, patient reports improvement in depressive symptoms following the reinitiation of medication.  She has only had 1 day of depressed mood in the interim related to feelings of worthlessness and real or perceived thoughts of abandonment.  Did screening for borderline diathesis however patient denied symptoms consistent with this outside of some impulsivity/irritability.  Patient not experienced any adverse side effects from reinitiation of medication and notes benefit from the propranolol.  We will titrate Prozac to 60 mg a day and reviewed the  risk and benefits.  Patient will follow up in 6 weeks.  Plan: - Increase Prozac to 60 mg bedtime - Continue Abilify 5 mg daily - Continue Propranolol 10 mg BID prn for anxiety - OTC Vit d3 1000 iu replacement recommended - CBC, CMP, lipid panel, A1c, TSH, Vitamin D reviewed 01/15/24 - Neuropsych testing referral - [facility]  - Crisis resources reviewed - Follow up in 6 weeks   Chief Complaint:  Chief Complaint  Patient presents with   Follow-up   HPI: Susan Bauer presents reporting that she has been doing a lot better in the interim.  She has not had as many bad days and feels like the medications been helping a lot.  We reviewed the bad days and patient reports that they have only occurred once in the past month.  On a bad day patient endorses feeling like her mood is declining.  They tend to occur out of the blue though can be related to perceived feelings of worthlessness.  For instance the most recent episode occurred when patient felt that her friends no longer liked her after not responding to her text.  From there several little things occurred that started to give her the idea that they disliked her.  Patient did ask for reassurance from her friends who provided it and she felt better afterwards.  Did review patient's real or perceived feelings of abandonment as well as her poor self worth.  Susan Bauer identifies that these are longstanding issues for her partially based on reality.  Did screening for symptoms of self-harm, dissociations, impulsivity, and irritability.  Patient endorsed some intermittent impulsivity and irritability though nothing overly extreme.  While patient has improved she does  not feel like she is quite at her baseline.  Previously she had been on 60 mg of Prozac and we will titrate up today.  Risk and benefits were reviewed.  Patient was also encouraged to continue with her therapist who she recently connected with.  Past  Psychiatric History: Patient has followed with Dr. Lucianne Muss, Anice Paganini, and Dr. Michae Kava in the past. She has been intermittently involved with therapy over the years. Looking into getting back into therapy. She denies any prior suicide attempts or past hospitalizations.   Has tried Ativan, Wellbutrin XL, Zoloft, BuSpar, and Daytrana in the past  She uses alcohol socially once a month around 3 drinks at a time. Caffeine (2-3 large cups of coffee a day). She denies any other substance use  Past Medical History:  Past Medical History:  Diagnosis Date   ADHD (attention deficit hyperactivity disorder)    Anxiety    Depression    Oppositional defiant disorder    Wears glasses     Past Surgical History:  Procedure Laterality Date   WISDOM TOOTH EXTRACTION      Family History:  Family History  Problem Relation Age of Onset   Depression Mother     Social History:  Social History   Socioeconomic History   Marital status: Single    Spouse name: Not on file   Number of children: Not on file   Years of education: Not on file   Highest education level: Not on file  Occupational History   Not on file  Tobacco Use   Smoking status: Never   Smokeless tobacco: Never  Vaping Use   Vaping status: Never Used  Substance and Sexual Activity   Alcohol use: Yes    Comment: Occasional beer   Drug use: No   Sexual activity: Never  Other Topics Concern   Not on file  Social History Narrative   Not on file   Social Drivers of Health   Financial Resource Strain: Not on file  Food Insecurity: Not on file  Transportation Needs: Not on file  Physical Activity: Not on file  Stress: Not on file  Social Connections: Not on file    Allergies:  Allergies  Allergen Reactions   Ritalin La [Methylphenidate Hcl Er (La)] Other (See Comments)    Tachycardia    Sulfa Antibiotics     Current Medications: Current Outpatient Medications  Medication Sig Dispense Refill   ARIPiprazole  (ABILIFY) 5 MG tablet Take 1 tablet (5 mg total) by mouth daily. 90 tablet 0   FLUoxetine (PROZAC) 20 MG capsule Take 1 capsule (20 mg total) by mouth daily. Take with 40 mg capsule for a total of 60 mg daily 90 capsule 0   FLUoxetine (PROZAC) 40 MG capsule Take 1 capsule (40 mg total) by mouth daily. Start after taking fluoxetine 20 mg for 2 weeks 90 capsule 0   norethindrone-ethinyl estradiol (JUNEL FE,GILDESS FE,LOESTRIN FE) 1-20 MG-MCG tablet Take 1 tablet by mouth daily.     propranolol (INDERAL) 10 MG tablet Take 1 tablet (10 mg total) by mouth 2 (two) times daily as needed. 60 tablet 2   No current facility-administered medications for this visit.     Psychiatric Specialty Exam: Review of Systems  There were no vitals taken for this visit.There is no height or weight on file to calculate BMI.  General Appearance: Well Groomed  Eye Contact:  Fair  Speech:  Clear and Coherent and Normal Rate  Volume:  Normal  Mood:  Euthymic and dysthymic  Affect:  Congruent  Thought Process:  Coherent  Orientation:  Full (Time, Place, and Person)  Thought Content: Logical   Suicidal Thoughts:  No  Homicidal Thoughts:  No  Memory:  Immediate;   Good  Judgement:  Good  Insight:  Fair  Psychomotor Activity:  Normal  Concentration:  Concentration: Good  Recall:  Good  Fund of Knowledge: Good  Language: Good  Akathisia:  No    AIMS (if indicated): not done  Assets:  Communication Skills Desire for Improvement Transportation Vocational/Educational  ADL's:  Intact  Cognition: WNL  Sleep:  Good   Metabolic Disorder Labs: Lab Results  Component Value Date   HGBA1C 5.3 01/15/2024   No results found for: "PROLACTIN" Lab Results  Component Value Date   CHOL 221 (H) 01/15/2024   TRIG 100 01/15/2024   HDL 68 01/15/2024   CHOLHDL 3.3 01/15/2024   LDLCALC 135 (H) 01/15/2024   Lab Results  Component Value Date   TSH 1.110 01/15/2024    Therapeutic Level Labs: No results found for:  "LITHIUM" No results found for: "VALPROATE" No results found for: "CBMZ"   Screenings: GAD-7    Flowsheet Row Video Visit [facility]01/2024 in [facility]  Total GAD-7 Score 6      PHQ2-9    Flowsheet Row Video Visit from 01/01/2024 in [facility] Video Visit from 05/08/2023 in [facility] Video Visit from 03/06/2023 in [facility] Video Visit from 01/09/2023 in [facility] Video Visit from 05[facility]16/2022 in [facility]  PHQ-2 Total Score 3 0 1 3 0  PHQ-9 Total Score 8 -- 3 11 --      Flowsheet Row Video Visit from 01/01/2024 in [facility] Video Visit from 05/08/2023 in [facility] Video Visit from 03/06/2023 in [facility]  C-SSRS RISK CATEGORY Low Risk No Risk No Risk       Collaboration of Care: Collaboration of Care: Medication Management AEB medication prescription  Patient/Guardian was advised Release of Information must be obtained prior to any record release in order to collaborate their care with an outside provider. Patient/Guardian was advised if they have not already done so to contact the registration department to sign all necessary forms in order for [place] to release information regarding their care.   Consent: Patient/Guardian gives verbal consent for treatment and assignment of benefits for services provided during this visit. Patient/Guardian expressed understanding and agreed to proceed.    Stasia Cavalier, MD 02/05/2024, 4:32 PM   Virtual Visit via Video Note  I connected with Arvella Nigh on 02/05/24 at  1:30 PM EDT by a video enabled telemedicine application and verified that I am speaking with the correct person using  two identifiers.  Location: Patient: Home Provider: Home Office   I discussed the limitations of evaluation and management by telemedicine and the availability of in person appointments. The patient expressed understanding and agreed to proceed.   I discussed the assessment and treatment plan with the patient. The patient was provided an opportunity to ask questions and all were answered. The patient agreed with the plan and demonstrated an understanding of the instructions.   The patient was advised to call back or seek an in-person evaluation if the symptoms worsen or if the condition fails to improve as anticipated.  I provided 17 minutes of non-face-to-face  time during this encounter.   Stasia Cavalier, MD

## 2024-02-05 ENCOUNTER — Telehealth (HOSPITAL_COMMUNITY): Payer: BC Managed Care – PPO | Admitting: Psychiatry

## 2024-02-05 ENCOUNTER — Other Ambulatory Visit (HOSPITAL_COMMUNITY): Payer: Self-pay

## 2024-02-05 DIAGNOSIS — F332 Major depressive disorder, recurrent severe without psychotic features: Secondary | ICD-10-CM | POA: Diagnosis not present

## 2024-02-05 DIAGNOSIS — F4310 Post-traumatic stress disorder, unspecified: Secondary | ICD-10-CM

## 2024-02-05 DIAGNOSIS — F411 Generalized anxiety disorder: Secondary | ICD-10-CM | POA: Diagnosis not present

## 2024-02-05 MED ORDER — FLUOXETINE HCL 20 MG PO CAPS
20.0000 mg | ORAL_CAPSULE | Freq: Every day | ORAL | 0 refills | Status: DC
  Filled 2024-02-05: qty 90, 90d supply, fill #0

## 2024-02-06 ENCOUNTER — Encounter (HOSPITAL_COMMUNITY): Payer: Self-pay | Admitting: Psychiatry

## 2024-03-22 NOTE — Progress Notes (Unsigned)
 BH MD/PA/NP OP Progress Note  03/22/2024 10:12 AM Susan Bauer  MRN:  [id number]  Visit Diagnosis:  No diagnosis found.   Assessment: Susan Bauer is a 28 y.o. female with a history of MDD, GAD, social anxiety disorder, and eating disorder unspecified who presented to [facility] at [facility] for initial evaluation on 01/01/2024.  At initial evaluation patient reported neurovegetative terms of depression including low mood, anhedonia, amotivation, fatigue, negative self thoughts, and passive SI without intent or plan.  She also endorsed symptoms of significant anxiety and PTSD including excessive worry that she has difficult controlling, increased irritability, intrusive thoughts, flashbacks, avoidance behaviors, and hypervigilance.  In the past patient has struggled with eating disorder and purging behaviors however denies that in several years.  She denies any psychosis, mania, paranoia, or delusions.  Crisis resources were reviewed.  Patient met criteria for MDD, GAD, and PTSD.  Susan Bauer presents for follow-up evaluation. Today, 03/22/24, patient reports    improvement in depressive symptoms following the reinitiation of medication.  She has only had 1 day of depressed mood in the interim related to feelings of worthlessness and real or perceived thoughts of abandonment.  Did screening for borderline diathesis however patient denied symptoms consistent with this outside of some impulsivity/irritability.  Patient not experienced any adverse side effects from reinitiation of medication and notes benefit from the propranolol .  We will titrate Prozac  to 60 mg a day and reviewed the risk and benefits.  Patient will follow up in 6 weeks.  Plan: - Increase Prozac  to 60 mg bedtime - Continue Abilify  5 mg daily - Continue Propranolol  10 mg BID prn for anxiety - OTC Vit d3 1000 iu replacement recommended - CBC, CMP, lipid panel, A1c, TSH, Vitamin D  reviewed 01/15/24 -  Neuropsych testing referral - [facility]  - Crisis resources reviewed - Follow up in 6 weeks   Chief Complaint:  No chief complaint on file.  HPI: Susan Bauer presents reporting that    she has been doing a lot better in the interim.  She has not had as many bad days and feels like the medications been helping a lot.  We reviewed the bad days and patient reports that they have only occurred once in the past month.  On a bad day patient endorses feeling like her mood is declining.  They tend to occur out of the blue though can be related to perceived feelings of worthlessness.  For instance the most recent episode occurred when patient felt that her friends no longer liked her after not responding to her text.  From there several little things occurred that started to give her the idea that they disliked her.  Patient did ask for reassurance from her friends who provided it and she felt better afterwards.  Did review patient's real or perceived feelings of abandonment as well as her poor self worth.  Susan Bauer identifies that these are longstanding issues for her partially based on reality.  Did screening for symptoms of self-harm, dissociations, impulsivity, and irritability.  Patient endorsed some intermittent impulsivity and irritability though nothing overly extreme.  While patient has improved she does not feel like she is quite at her baseline.  Previously she had been on 60 mg of Prozac  and we will titrate up today.  Risk and benefits were reviewed.  Patient was also encouraged to continue with her therapist who she recently connected with.  Past Psychiatric History: Patient has  followed with Dr. Hubert Madden, Georges Kings, and Dr. Katrine Parody in the past. She has been intermittently involved with therapy over the years. Looking into getting back into therapy. She denies any prior suicide attempts or past hospitalizations.   Has tried Ativan , Wellbutrin  XL, Zoloft ,  BuSpar , and Daytrana  in the past  She uses alcohol socially once a month around 3 drinks at a time. Caffeine (2-3 large cups of coffee a day). She denies any other substance use  Past Medical History:  Past Medical History:  Diagnosis Date   ADHD (attention deficit hyperactivity disorder)    Anxiety    Depression    Oppositional defiant disorder    Wears glasses     Past Surgical History:  Procedure Laterality Date   WISDOM TOOTH EXTRACTION      Family History:  Family History  Problem Relation Age of Onset   Depression Mother     Social History:  Social History   Socioeconomic History   Marital status: Single    Spouse name: Not on file   Number of children: Not on file   Years of education: Not on file   Highest education level: Not on file  Occupational History   Not on file  Tobacco Use   Smoking status: Never   Smokeless tobacco: Never  Vaping Use   Vaping status: Never Used  Substance and Sexual Activity   Alcohol use: Yes    Comment: Occasional beer   Drug use: No   Sexual activity: Never  Other Topics Concern   Not on file  Social History Narrative   Not on file   Social Drivers of Health   Financial Resource Strain: Not on file  Food Insecurity: Not on file  Transportation Needs: Not on file  Physical Activity: Not on file  Stress: Not on file  Social Connections: Not on file    Allergies:  Allergies  Allergen Reactions   Ritalin  La [Methylphenidate  Hcl Er (La)] Other (See Comments)    Tachycardia    Sulfa Antibiotics     Current Medications: Current Outpatient Medications  Medication Sig Dispense Refill   ARIPiprazole  (ABILIFY ) 5 MG tablet Take 1 tablet (5 mg total) by mouth daily. 90 tablet 0   FLUoxetine  (PROZAC ) 20 MG capsule Take 1 capsule (20 mg total) by mouth daily. Take with 40 mg capsule for a total of 60 mg daily 90 capsule 0   FLUoxetine  (PROZAC ) 40 MG capsule Take 1 capsule (40 mg total) by mouth daily. Start after taking  fluoxetine  20 mg for 2 weeks 90 capsule 0   norethindrone-ethinyl estradiol (JUNEL FE,GILDESS FE,LOESTRIN FE) 1-20 MG-MCG tablet Take 1 tablet by mouth daily.     propranolol  (INDERAL ) 10 MG tablet Take 1 tablet (10 mg total) by mouth 2 (two) times daily as needed. 60 tablet 2   No current facility-administered medications for this visit.     Psychiatric Specialty Exam: Review of Systems  There were no vitals taken for this visit.There is no height or weight on file to calculate BMI.  General Appearance: Well Groomed  Eye Contact:  Fair  Speech:  Clear and Coherent and Normal Rate  Volume:  Normal  Mood:  Euthymic and dysthymic  Affect:  Congruent  Thought Process:  Coherent  Orientation:  Full (Time, Place, and Person)  Thought Content: Logical   Suicidal Thoughts:  No  Homicidal Thoughts:  No  Memory:  Immediate;   Good  Judgement:  Good  Insight:  Fair  Psychomotor Activity:  Normal  Concentration:  Concentration: Good  Recall:  Good  Fund of Knowledge: Good  Language: Good  Akathisia:  No    AIMS (if indicated): not done  Assets:  Communication Skills Desire for Improvement Transportation Vocational/Educational  ADL's:  Intact  Cognition: WNL  Sleep:  Good   Metabolic Disorder Labs: Lab Results  Component Value Date   HGBA1C 5.3 01/15/2024   No results found for: "PROLACTIN" Lab Results  Component Value Date   CHOL 221 (H) 01/15/2024   TRIG 100 01/15/2024   HDL 68 01/15/2024   CHOLHDL 3.3 01/15/2024   LDLCALC 135 (H) 01/15/2024   Lab Results  Component Value Date   TSH 1.110 01/15/2024    Therapeutic Level Labs: No results found for: "LITHIUM" No results found for: "VALPROATE" No results found for: "CBMZ"   Screenings: GAD-7    Flowsheet Row Video Visit from 01/01/2024 in [facility]  Total GAD-7 Score 6      PHQ2-9    Flowsheet Row Video Visit from 01/01/2024 in [facility] Video Visit from 05/08/2023 in [facility] Video Visit from 03[facility]06/2023 in [facility] Video Visit from 01[facility]09/2023 in [facility] Video Visit from 05/16/2022 in [facility]  PHQ-2 Total Score 3 0 1 3 0  PHQ-9 Total Score 8 -- 3 11 --      Flowsheet Row Video Visit from 01/01/2024 in [facility] Video Visit from 05/08/2023 in [facility] Video Visit from 03/06/2023 in [facility]  C-SSRS RISK CATEGORY Low Risk No Risk No Risk       Collaboration of Care: Collaboration of Care: Medication Management AEB medication prescription  Patient/Guardian was advised Release of Information must be obtained prior to any record release in order to collaborate their care with an outside provider. Patient/Guardian was advised if they have not already done so to contact the registration department to sign all necessary forms in order for us  to release information regarding their care.   Consent: Patient/Guardian gives verbal consent for treatment and assignment of benefits for services provided during this visit. Patient/Guardian expressed understanding and agreed to proceed.    Yves Herb, MD 03/22/2024, 10:12 AM   Virtual Visit via Video Note  I connected with Norris Saraceno on 03/22/24 at  1:30 PM EDT by a video enabled telemedicine application and verified that I am speaking with the correct person using two identifiers.  Location: Patient: Home Provider: Home Office   I discussed the limitations of evaluation and management by telemedicine and the availability of in person appointments. The patient expressed understanding and agreed to proceed.   I discussed the assessment and treatment plan with the patient. The  patient was provided an opportunity to ask questions and all were answered. The patient agreed with the plan and demonstrated an understanding of the instructions.   The patient was advised to call back or seek an in-person evaluation if the symptoms worsen or if the condition fails to improve as anticipated.  I provided 17 minutes of non-face-to-face time during this encounter.   Yves Herb, MD

## 2024-03-25 ENCOUNTER — Encounter (HOSPITAL_COMMUNITY): Admitting: Psychiatry

## 2024-03-25 ENCOUNTER — Encounter (HOSPITAL_COMMUNITY): Payer: Self-pay

## 2024-03-25 NOTE — Progress Notes (Signed)
 This encounter was created in error - please disregard.  Patient did not show up for the appointment. Appointment reminders were sent to her phone and email. Attempted to call the patient at 1:35 pm but received no response. Left a voicemail for patient to reschedule.

## 2024-07-30 ENCOUNTER — Other Ambulatory Visit (HOSPITAL_COMMUNITY): Payer: Self-pay

## 2024-07-30 ENCOUNTER — Ambulatory Visit (HOSPITAL_COMMUNITY)
Admission: EM | Admit: 2024-07-30 | Discharge: 2024-07-30 | Disposition: A | Discharge status: Home / Self Care | Attending: Family Medicine | Admitting: Family Medicine

## 2024-07-30 DIAGNOSIS — Z79899 Other long term (current) drug therapy: Secondary | ICD-10-CM | POA: Insufficient documentation

## 2024-07-30 DIAGNOSIS — F411 Generalized anxiety disorder: Secondary | ICD-10-CM | POA: Insufficient documentation

## 2024-07-30 DIAGNOSIS — F4310 Post-traumatic stress disorder, unspecified: Secondary | ICD-10-CM | POA: Insufficient documentation

## 2024-07-30 DIAGNOSIS — F331 Major depressive disorder, recurrent, moderate: Secondary | ICD-10-CM | POA: Insufficient documentation

## 2024-07-30 DIAGNOSIS — R45851 Suicidal ideations: Secondary | ICD-10-CM | POA: Insufficient documentation

## 2024-07-30 MED ORDER — FLUOXETINE HCL 20 MG PO CAPS
20.0000 mg | ORAL_CAPSULE | Freq: Every day | ORAL | 0 refills | Status: DC
  Filled 2024-07-30: qty 30, 30d supply, fill #0

## 2024-07-30 NOTE — Discharge Instructions (Addendum)
 Discharge Recommendations:   Medications: Patient is to take medications as prescribed. The patient or patient's guardian is to contact a medical professional and/or outpatient provider to address any new side effects that develop. The patient or the patient's guardian should update outpatient providers of any new medications and/or medication changes.   A prescription for Prozac  20 mg has been sent to your pharmacy   Outpatient Follow up: Please review list of outpatient resources for psychiatry and counseling. Please follow up with your primary care provider for all medical related needs.    Therapy: We recommend that patient participate in individual therapy to address mental health concerns.   Safety:   The following safety precautions should be taken:   No sharp objects. This includes scissors, razors, scrapers, and putty knives.   Chemicals should be removed and locked up.   Medications should be removed and locked up.   Weapons should be removed and locked up. This includes firearms, knives and instruments that can be used to cause injury.   The patient should abstain from use of illicit substances/drugs and abuse of any medications.  If symptoms worsen or do not continue to improve or if the patient becomes actively suicidal or homicidal then it is recommended that the patient return to the closest hospital emergency department, the [facility], or call 911 for further evaluation and treatment.  National Suicide Prevention Lifeline 1-800-SUICIDE or (641)454-0107.  About 988 988 offers 24/7 access to trained crisis counselors who can help people experiencing mental health-related distress. People can call or text 988 or chat 988lifeline.org for themselves or if they are worried about a loved one who may need crisis support.

## 2024-07-30 NOTE — ED Provider Notes (Signed)
 Behavioral Health Urgent Care Medical Screening Exam  Patient Name: Susan Bauer MRN: [id number] Date of Evaluation: 07/31/24 Chief Complaint: Basically feel like I'm shutting down Diagnosis:  Final diagnoses:  Major depressive disorder, recurrent episode, moderate (HCC)    History of Present illness: Susan Bauer is a 28 y.o. female Ventress is a 28 year old female presenting to [facility] accompanied by her mother. Pt is currently diagnosed with GAD, MDD, and PTSD. Pt states I feel like there is no point to anything anymore. Pt states she does not feel like she can be herself and hangout around her friends because she feels so anxious and depressed. Pt states she has off and on suicidal thoughts, but no past suicide attempts. Pt states she is looking for a therapist at this time. Pt has been prescribed medication to help with her anxiety/depression, but is not taking them. Pts appearance is neat, eye contact is normal, affect is normal, motor activity is normal. Pt denies substance use, Hi and AVH currently.   Chart reviewed with attending psychiatrist, Dr Garvin Gaines.  Susan Bauer is seen face-to-face on the [facility] treatment area. Pt is alert & oriented x 4 and engages in today's assessment. Today, pt states basically feel like I'm shutting down. Pt states she has been feeling this way for over a year and feels symptoms are worsening. Pt endorses, low mood, and anhedonia; don't enjoy anything any more; disconnecting from everyone. States she has to force herself to hang out with friends but I don't enjoy it. She identifies current stressors as friends moving on, getting married and having kids and I'm still living at home; I feel kinda useless and pathetic. States appetite is fair; I know I should be eating better and making better food choices and endorses good sleep, averaging 8 hours/night most nights of the week. States becoming super depressed prior to her menstrual cycle. Also  endorses anxiety related to driving due to trusting myself. States she only drives to and from work. Rates depression 7/10 and 5/10 with 10 being worse.   Mental health history as noted below.  Psych History: Depression, Anxiety, PTSD Hospitalizations: no inpatient hospitalizations Medication trials: Zoloft , Wellbutrin  (made depression worse), Buspar , lorazepam  Most recent medications: Prozac , Abilify , propranolol  - stopped all meds last month due to insurance States meds were Rx'd by Dr Carvin at [facility] with last appt this past summer.  Suicide attempts: denies Suicidal ideation: off and on for the past year. Worsening 2 weeks ago. Unable to identify trigger. Denies AVH Substance Use: occasional ETOH use with last use a month ago  Family History: Mother: Depression, HTN Father: none reported  Sister: depression  Housing: lives with her mother. States this is ok and feels safe in current living environment. States she and her mother butt heads sometimes and pt wants to be out of the house in her own housing.  Employed: works full-time as a Patent examiner: denies access to weapons in her home  There is no evidence of psychosis/mania, delusional thinking, or significant mental health impairment. Pt is able to converse coherently, with goal directed thoughts, and no distractibility, or pre-occupation. She has responded appropriately to questions and has remained calm and cooperative throughout assessment. She is requesting to restart antidepressant with plan to re-establish services at previous outpatient psych provider or with an alternate provider. She will be provided with outpatient resources.   Flowsheet Row ED from 07/30/2024 in [facility] Video Visit  from 01/01/2024 in [facility] Video Visit from [facility]2023 [facility]  C-SSRS RISK  CATEGORY Low Risk Low Risk No Risk    Psychiatric Specialty Exam  Presentation  General Appearance:Appropriate for Environment; Casual  Eye Contact:Good  Speech:Clear and Coherent; Normal Rate  Speech Volume:Normal  Handedness:No data recorded  Mood and Affect  Mood:Depressed  Affect:Appropriate   Thought Process  Thought Processes:Coherent  Descriptions of Associations:Intact  Orientation:Full (Time, Place and Person)  Thought Content:Logical    Hallucinations:None  Ideas of Reference:None  Suicidal Thoughts:Yes, Passive Without Plan; Without Intent  Homicidal Thoughts:No   Sensorium  Memory:Recent Good; Immediate Good  Judgment:Good  Insight:Good   Executive Functions  Concentration:Good  Attention Span:Good  Recall:Good  Fund of Knowledge:Good  Language:Good   Psychomotor Activity  Psychomotor Activity:Normal   Assets  Assets:Communication Skills; Desire for Improvement; Physical Health; Resilience; Housing; Vocational/Educational   Sleep  Sleep:Good  Number of hours: 8   Physical Exam: Physical Exam Vitals and nursing note reviewed.  Constitutional:      Appearance: Normal appearance.  HENT:     Head: Normocephalic.     Mouth/Throat:     Mouth: Mucous membranes are moist.  Cardiovascular:     Rate and Rhythm: Normal rate.  Pulmonary:     Effort: Pulmonary effort is normal.  Musculoskeletal:        General: Normal range of motion.  Skin:    General: Skin is warm and dry.  Neurological:     Mental Status: She is alert and oriented to person, place, and time.  Psychiatric:     Comments: See HPI    Review of Systems  Constitutional:  Negative for chills.  HENT:  Negative for congestion.   Respiratory:  Negative for cough and shortness of breath.   Cardiovascular:  Negative for chest pain and palpitations.  Gastrointestinal:  Negative for diarrhea, nausea and vomiting.  Genitourinary:        Lmp: last weekend     Blood pressure (!) 145/79, pulse 65, temperature 98.7 F (37.1 C), temperature source Oral, resp. rate 20, SpO2 99%. There is no height or weight on file to calculate BMI.  Musculoskeletal: Strength & Muscle Tone: within normal limits Gait & Station: normal Patient leans: N/A   [facility] MSE Discharge Disposition for Follow up and Recommendations: Based on my evaluation the patient does not appear to have an emergency medical condition and can be discharged with resources and follow up care in outpatient services for Medication Management  The patient has been provided with outpatient resources. Crisis contact information and instructions for accessing emergency services have also been provided. The patient is aware of their discharge plan, demonstrates insight into their condition, and agrees to adhere to the recommended follow-up care. No acute safety concerns or active suicidal ideation are present at the time of discharge.   Start Prozac  20 mg PO QAM - Rx sent to pharmacy of record  Sherrell Culver, PMHNP-BC, FNP-BC  07/30/2024, 4:36 PM
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
## 2024-08-04 ENCOUNTER — Telehealth (HOSPITAL_COMMUNITY): Payer: Self-pay

## 2024-08-04 ENCOUNTER — Telehealth (HOSPITAL_COMMUNITY): Payer: Self-pay | Admitting: Psychiatry

## 2024-08-04 NOTE — Telephone Encounter (Signed)
 8:43pm 08/04/24 Good morning! This patient presented today as a walk in for MM .... she was seen at [facility] on Friday & they prescribed her prozac  30 day supply patient states they told her to come here as a follow up but they tell everyone that.... what should I do with her? Please advise Thanks   I spoke with patient and according to the patient currently the patient states that she has No Insurance  explained that because she has no insurance she will have to seen at [facility].  Anjenette will call her with an appointment. Dr. Carvin is aware.

## 2024-08-04 NOTE — Telephone Encounter (Unsigned)
 Called pt so schedule an appointment for Susan Bauer. Patient did

## 2024-11-14 ENCOUNTER — Telehealth: Admitting: Nurse Practitioner

## 2024-11-14 DIAGNOSIS — J40 Bronchitis, not specified as acute or chronic: Secondary | ICD-10-CM

## 2024-11-14 MED ORDER — PROMETHAZINE-DM 6.25-15 MG/5ML PO SYRP
5.0000 mL | ORAL_SOLUTION | Freq: Four times a day (QID) | ORAL | 0 refills | Status: AC | PRN
  Filled 2024-11-14: qty 240, 12d supply, fill #0

## 2024-11-14 MED ORDER — BENZONATATE 200 MG PO CAPS
200.0000 mg | ORAL_CAPSULE | Freq: Two times a day (BID) | ORAL | 0 refills | Status: AC | PRN
  Filled 2024-11-14: qty 20, 10d supply, fill #0

## 2024-11-14 NOTE — Progress Notes (Signed)
 We are sorry that you are not feeling well.  Here is how we plan to help!  Based on your presentation I believe you most likely have A cough due to a virus.  This is called viral bronchitis and is best treated by rest, plenty of fluids and control of the cough.  You may use Ibuprofen or Tylenol as directed to help your symptoms.     In addition you may use a prescription cough syrup that has been sent along with cough pills.   From your responses in the eVisit questionnaire you describe inflammation in the upper respiratory tract which is causing a significant cough.  This is commonly called Bronchitis and has four common causes:   Allergies Viral Infections Acid Reflux Bacterial Infection Allergies, viruses and acid reflux are treated by controlling symptoms or eliminating the cause. An example might be a cough caused by taking certain blood pressure medications. You stop the cough by changing the medication. Another example might be a cough caused by acid reflux. Controlling the reflux helps control the cough.  USE OF BRONCHODILATOR (RESCUE) INHALERS: There is a risk from using your bronchodilator too frequently.  The risk is that over-reliance on a medication which only relaxes the muscles surrounding the breathing tubes can reduce the effectiveness of medications prescribed to reduce swelling and congestion of the tubes themselves.  Although you feel brief relief from the bronchodilator inhaler, your asthma may actually be worsening with the tubes becoming more swollen and filled with mucus.  This can delay other crucial treatments, such as oral steroid medications. If you need to use a bronchodilator inhaler daily, several times per day, you should discuss this with your provider.  There are probably better treatments that could be used to keep your asthma under control.     HOME CARE Only take medications as instructed by your medical team. Complete the entire course of an  antibiotic. Drink plenty of fluids and get plenty of rest. Avoid close contacts especially the very young and the elderly Cover your mouth if you cough or cough into your sleeve. Always remember to wash your hands A steam or ultrasonic humidifier can help congestion.   GET HELP RIGHT AWAY IF: You develop worsening fever. You become short of breath You cough up blood. Your symptoms persist after you have completed your treatment plan MAKE SURE YOU  Understand these instructions. Will watch your condition. Will get help right away if you are not doing well or get worse.  Your e-visit answers were reviewed by a board certified advanced clinical practitioner to complete your personal care plan.  Depending on the condition, your plan could have included both over the counter or prescription medications. If there is a problem please reply  once you have received a response from your provider. Your safety is important to us .  If you have drug allergies check your prescription carefully.    You can use MyChart to ask questions about todays visit, request a non-urgent call back, or ask for a work or school excuse for 24 hours related to this e-Visit. If it has been greater than 24 hours you will need to follow up with your provider, or enter a new e-Visit to address those concerns. You will get an e-mail in the next two days asking about your experience.  I hope that your e-visit has been valuable and will speed your recovery. Thank you for using e-visits.   I have spent 5 minutes in review of  e-visit questionnaire, review and updating patient chart, medical decision making and response to patient.   Telford Archambeau W Dabney Schanz, NP

## 2024-11-15 ENCOUNTER — Other Ambulatory Visit (HOSPITAL_COMMUNITY): Payer: Self-pay

## 2024-11-15 NOTE — Progress Notes (Unsigned)
 BH MD/PA/NP OP Progress Note  11/17/2024 8:01 AM Susan Bauer  MRN:  [id number]  Visit Diagnosis:    ICD-10-CM   1. GAD (generalized anxiety disorder)  F41.1 propranolol  (INDERAL ) 10 MG tablet    FLUoxetine  (PROZAC ) 20 MG capsule    FLUoxetine  (PROZAC ) 40 MG capsule    DISCONTINUED: FLUoxetine  (PROZAC ) 20 MG capsule    2. Major depressive disorder, recurrent, severe without psychotic features (HCC)  F33.2 ARIPiprazole  (ABILIFY ) 2 MG tablet    FLUoxetine  (PROZAC ) 20 MG capsule    FLUoxetine  (PROZAC ) 40 MG capsule    DISCONTINUED: FLUoxetine  (PROZAC ) 20 MG capsule     Assessment: Susan Bauer is a 29 y.o. female with a history of MDD, GAD, social anxiety disorder, and eating disorder unspecified who presents virtually to [facility] at [facility] for initial evaluation on 01/01/2024.  At initial evaluation patient reported neurovegetative terms of depression including low mood, anhedonia, amotivation, fatigue, negative self thoughts, and passive SI without intent or plan.  She also endorsed symptoms of significant anxiety and PTSD including excessive worry that she has difficult controlling, increased irritability, intrusive thoughts, flashbacks, avoidance behaviors, and hypervigilance.  In the past patient has struggled with eating disorder and purging behaviors however denies that in several years.  She denies any psychosis, mania, paranoia, or delusions.  Crisis resources were reviewed.  Patient met criteria for MDD, GAD, and PTSD.  Susan Bauer presents for follow-up evaluation. Today, 11/17/2024, patient had been without insurance for several months leading to discontinuation of medication and increase in depressive symptoms.  She had presented to the urgent care once and was briefly restarted on lower dose of Prozac .  Presenting today she continues to endorse depression with amotivation, fatigue, low mood, poor sleep, anhedonia, and passive SI.  She has been able to  consistently attend work and denies any suicidal intent or plan.  Will restart Prozac  at 20 mg and titrate to 20 mg every week until at 60 mg daily dosing.  Will restart Abilify  2 mg daily and propranolol  10 mg twice a day as needed for anxiety.  Risk and benefits of medications were reviewed.  Patient will follow-up in a month.  Plan: - Restart Prozac  20 mg daily and increase by 20 mg every week until back at 60 mg daily dosing - Restart Abilify  2 mg daily - Continue Propranolol  10 mg BID prn for anxiety - OTC Vit d3 1000 iu replacement recommended - CBC, CMP, lipid panel, A1c, TSH, Vitamin D  reviewed 01/15/24  - PCP appointment in February and plans to update metabolic labs as well as get EKG - Neuropsych testing referral - Restart therapy with [facility]  - Crisis resources reviewed - Follow up in a month  Chief Complaint:  Chief Complaint  Patient presents with   Follow-up   HPI: Pt presented to [facility] in September reporting depressed mood with passive SI. Notably she had not been taking prescribed medication. Prozac  was restarted at that time which helped to take the edge off. She was only on it for a month before running out of medicine.   Susan Bauer presents reporting that she is doing ok.  Mood wise things are about the same as they were in September she is just handling it a bit better.  Patient still endorses intermittent thoughts of suicide and denies any intent or plan.  The thoughts are less intrusive than they had been last September.  Patient's been focusing primarily on coping  and getting through until she was able to get back on insurance and restart her medications.  Patient has been working to stay active more on the weekends and will go out with friends or leave the house to watch movie.  On weekdays however after work she would do the bare minimum before shutting down.  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  She had worked his previously and did not find it difficult to pick back up again.  Patient enjoys the job for the most part without significant issue.  At home things have been a bit more stressful. Even with the job she can not afford to get her own place. Frustrated to be living with her mom as she nears 16. Her mom can take that frustration personally. When she was less depressed at home it did not feel as oppressive.   Discussed medications and patient reports that restarting the Prozac  the 20 mg dose had been moderately helpful back in September.  Last appointment back in March 2025 when we increased Prozac  to 60 she reports that her mood symptoms were better.  Similarly found Abilify  helpful for managing intrusive thoughts and propranolol  for as needed for anxiety.  Discussed restarting the Prozac  and titrating back to the 60 mg dose as well as restarting Abilify  at lower dosing.  Past Psychiatric History: Patient has followed with Dr. Von, Virgia Heaps, and Dr. Brutus in the past. She has been intermittently involved with therapy over the years. Looking into getting back into therapy. She denies any prior suicide attempts or past hospitalizations.   Has tried Ativan , Wellbutrin  XL, Zoloft , BuSpar , and Daytrana  in the past  She uses alcohol socially once a month around 3 drinks at a time. Caffeine (2-3 large cups of coffee a day). She denies any other substance use  Past Medical History:  Past Medical History:  Diagnosis Date   ADHD (attention deficit hyperactivity disorder)    Anxiety    Depression    Oppositional defiant disorder    Wears glasses     Past Surgical History:  Procedure Laterality Date   WISDOM TOOTH EXTRACTION      Family History:  Family History  Problem Relation Age of Onset   Depression Mother     Social History:  Social History   Socioeconomic History   Marital status: Single    Spouse name: Not on file   Number of children: Not on  file   Years of education: Not on file   Highest education level: Not on file  Occupational History   Not on file  Tobacco Use   Smoking status: Never   Smokeless tobacco: Never  Vaping Use   Vaping status: Never Used  Substance and Sexual Activity   Alcohol use: Yes    Comment: Occasional beer   Drug use: No   Sexual activity: Never  Other Topics Concern   Not on file  Social History Narrative   Not on file   Social Drivers of Health   Tobacco Use: Low Risk (11/16/2024)   Patient History    Smoking Tobacco Use: Never    Smokeless Tobacco Use: Never    Passive Exposure: Not on file  Financial Resource Strain: Not on file  Food Insecurity: Not on file  Transportation Needs: Not on file  Physical Activity: Not on file  Stress: Not on file  Social Connections: Not on file  Depression (PHQ2-9): Medium  Risk (01/01/2024)   Depression (PHQ2-9)    PHQ-2 Score: 8  Alcohol Screen: Not on file  Housing: Not on file  Utilities: Not on file  Health Literacy: Not on file    Allergies:  Allergies  Allergen Reactions   Ritalin  La [Methylphenidate  Hcl Er (La)] Other (See Comments)    Tachycardia    Sulfa Antibiotics     Current Medications: Current Outpatient Medications  Medication Sig Dispense Refill   ARIPiprazole  (ABILIFY ) 2 MG tablet Take 1 tablet (2 mg total) by mouth daily. 30 tablet 2   FLUoxetine  (PROZAC ) 40 MG capsule Take 1 capsule (40 mg total) by mouth daily. Start after taking Prozac  20 mg daily for 1 week. After taking Prozac  40 daily mg for 1 week increase the dose to Prozac  60 mg daily (40 mg capsule plus a 20 mg capsule) 30 capsule 2   propranolol  (INDERAL ) 10 MG tablet Take 1 tablet (10 mg total) by mouth 2 (two) times daily as needed. 60 tablet 2   benzonatate  (TESSALON ) 200 MG capsule Take 1 capsule (200 mg total) by mouth 2 (two) times daily as needed for cough. 20 capsule 0   FLUoxetine  (PROZAC ) 20 MG capsule Take 1 capsule (20 mg total) by mouth daily.  Take Prozac  20 mg daily for 1 week, 40 mg daily for one week, and then increase to 60 mg (20 mg capsule and 40 mg capsule) daily 30 capsule 2   norethindrone-ethinyl estradiol (JUNEL FE,GILDESS FE,LOESTRIN FE) 1-20 MG-MCG tablet Take 1 tablet by mouth daily.     promethazine -dextromethorphan (PROMETHAZINE -DM) 6.25-15 MG/5ML syrup Take 5 mLs by mouth 4 (four) times daily as needed. 240 mL 0   No current facility-administered medications for this visit.     Psychiatric Specialty Exam: Review of Systems  There were no vitals taken for this visit.There is no height or weight on file to calculate BMI.  General Appearance: Well Groomed  Eye Contact:  Fair  Speech:  Clear and Coherent and Normal Rate  Volume:  Normal  Mood:  Euthymic and dysthymic  Affect:  Congruent  Thought Process:  Coherent  Orientation:  Full (Time, Place, and Person)  Thought Content: Logical   Suicidal Thoughts:  No  Homicidal Thoughts:  No  Memory:  Immediate;   Good  Judgement:  Good  Insight:  Fair  Psychomotor Activity:  Normal  Concentration:  Concentration: Good  Recall:  Good  Fund of Knowledge: Good  Language: Good  Akathisia:  No    AIMS (if indicated): not done  Assets:  Communication Skills Desire for Improvement Transportation Vocational/Educational  ADL's:  Intact  Cognition: WNL  Sleep:  Good   Metabolic Disorder Labs: Lab Results  Component Value Date   HGBA1C 5.3 01/15/2024   No results found for: PROLACTIN Lab Results  Component Value Date   CHOL 221 (H) 01/15/2024   TRIG 100 01/15/2024   HDL 68 01/15/2024   CHOLHDL 3.3 01/15/2024   LDLCALC 135 (H) 01/15/2024   Lab Results  Component Value Date   TSH 1.110 01/15/2024    Therapeutic Level Labs: No results found for: LITHIUM No results found for: VALPROATE No results found for: CBMZ   Screenings: GAD-7    Flowsheet Row [facility]  Total  GAD-7 Score 6   PHQ2-9    Flowsheet Row Video Visit [facility] 01/01/2024 in [facility] Video Visit from 05[facility]08/2023 in [facility]  Video Visit from 03/06/2023 in [facility] Video Visit from 01/09/2023 in [facility] Video Visit from [facility]16/2022 [facility]  PHQ-2 Total Score 3 0 1 3 0  PHQ-9 Total Score 8 -- 3 11 --   Flowsheet Row ED from 07/30/2024 in [facility] Video Visit from 01/01/2024 in [facility] Video Visit from 05/08/2023 in [facility]  C-SSRS RISK CATEGORY Low Risk Low Risk No Risk    Collaboration of Care: Collaboration of Care: Medication Management AEB medication prescription  Patient/Guardian was advised Release of Information must be obtained prior to any record release in order to collaborate their care with an outside provider. Patient/Guardian was advised if they have not already done so to contact the registration department to sign all necessary forms in order for us  to release information regarding their care.   Consent: Patient/Guardian gives verbal consent for treatment and assignment of benefits for services provided during this visit. Patient/Guardian expressed understanding and agreed to proceed.    Susan CHRISTELLA Finder, MD 11/17/2024, 8:01 AM   Virtual Visit via Video Note  I connected with Susan Bauer on 11/17/2024 at  4:00 PM EST by a video enabled telemedicine application and verified that I am speaking with the correct person using two identifiers.  Location: Patient: Home Provider: Home Office   I discussed the limitations of evaluation and management by telemedicine and the availability of in person appointments. The patient expressed understanding and agreed to  proceed.   I discussed the assessment and treatment plan with the patient. The patient was provided an opportunity to ask questions and all were answered. The patient agreed with the plan and demonstrated an understanding of the instructions.   The patient was advised to call back or seek an in-person evaluation if the symptoms worsen or if the condition fails to improve as anticipated.  I provided 17 minutes of non-face-to-face time during this encounter.   Susan CHRISTELLA Finder, MD

## 2024-11-16 ENCOUNTER — Telehealth (HOSPITAL_BASED_OUTPATIENT_CLINIC_OR_DEPARTMENT_OTHER): Admitting: Psychiatry

## 2024-11-16 ENCOUNTER — Other Ambulatory Visit: Payer: Self-pay

## 2024-11-16 ENCOUNTER — Other Ambulatory Visit (HOSPITAL_COMMUNITY): Payer: Self-pay

## 2024-11-16 ENCOUNTER — Encounter (HOSPITAL_COMMUNITY): Payer: Self-pay | Admitting: Psychiatry

## 2024-11-16 DIAGNOSIS — F411 Generalized anxiety disorder: Secondary | ICD-10-CM | POA: Diagnosis not present

## 2024-11-16 DIAGNOSIS — F332 Major depressive disorder, recurrent severe without psychotic features: Secondary | ICD-10-CM | POA: Diagnosis not present

## 2024-11-16 MED ORDER — FLUOXETINE HCL 20 MG PO CAPS
20.0000 mg | ORAL_CAPSULE | Freq: Every day | ORAL | 2 refills | Status: AC
  Filled 2024-11-16: qty 30, 30d supply, fill #0
  Filled 2024-12-15: qty 30, 30d supply, fill #1

## 2024-11-16 MED ORDER — ARIPIPRAZOLE 2 MG PO TABS
2.0000 mg | ORAL_TABLET | Freq: Every day | ORAL | 2 refills | Status: AC
  Filled 2024-11-16: qty 30, 30d supply, fill #0
  Filled 2024-12-15: qty 30, 30d supply, fill #1

## 2024-11-16 MED ORDER — FLUOXETINE HCL 20 MG PO CAPS
20.0000 mg | ORAL_CAPSULE | Freq: Every day | ORAL | 0 refills | Status: DC
  Filled 2024-11-16: qty 30, 30d supply, fill #0

## 2024-11-16 MED ORDER — PROPRANOLOL HCL 10 MG PO TABS
10.0000 mg | ORAL_TABLET | Freq: Two times a day (BID) | ORAL | 2 refills | Status: AC | PRN
  Filled 2024-11-16: qty 60, 30d supply, fill #0

## 2024-11-16 MED ORDER — FLUOXETINE HCL 40 MG PO CAPS
40.0000 mg | ORAL_CAPSULE | Freq: Every day | ORAL | 2 refills | Status: AC
  Filled 2024-11-16: qty 30, 30d supply, fill #0

## 2024-11-17 ENCOUNTER — Other Ambulatory Visit (HOSPITAL_COMMUNITY): Payer: Self-pay

## 2024-11-17 ENCOUNTER — Telehealth: Admitting: Emergency Medicine

## 2024-11-17 ENCOUNTER — Encounter

## 2024-11-17 DIAGNOSIS — R519 Headache, unspecified: Secondary | ICD-10-CM | POA: Diagnosis not present

## 2024-11-17 DIAGNOSIS — R058 Other specified cough: Secondary | ICD-10-CM | POA: Diagnosis not present

## 2024-11-17 NOTE — Progress Notes (Signed)
 " Virtual Visit Consent   [name], you are scheduled for a virtual visit with a [facility] provider today. Just as with appointments in the office, your consent must be obtained to participate. Your consent will be active for this visit and any virtual visit you may have with one of our providers in the next 365 days. If you have a MyChart account, a copy of this consent can be sent to you electronically.  As this is a virtual visit, video technology does not allow for your provider to perform a traditional examination. This may limit your provider's ability to fully assess your condition. If your provider identifies any concerns that need to be evaluated in person or the need to arrange testing (such as labs, EKG, etc.), we will make arrangements to do so. Although advances in technology are sophisticated, we cannot ensure that it will always work on either your end or our end. If the connection with a video visit is poor, the visit may have to be switched to a telephone visit. With either a video or telephone visit, we are not always able to ensure that we have a secure connection.  By engaging in this virtual visit, you consent to the provision of healthcare and authorize for your insurance to be billed (if applicable) for the services provided during this visit. Depending on your insurance coverage, you may receive a charge related to this service.  I need to obtain your verbal consent now. Are you willing to proceed with your visit today? Susan Bauer has provided verbal consent on 11/17/2024 for a virtual visit (video or telephone). Jon CHRISTELLA Belt, NP  Date: 11/17/2024 7:23 PM   Virtual Visit via Video Note   I, Jon CHRISTELLA Belt, connected with  Susan Bauer  ([id number], 1996/03/14) on 11/17/2024 at  7:15 PM EST by a video-enabled telemedicine application and verified that I am speaking with the correct person using two identifiers.  Location: Patient: Virtual Visit Location Patient:  Home Provider: Virtual Visit Location Provider: Home Office   I discussed the limitations of evaluation and management by telemedicine and the availability of in person appointments. The patient expressed understanding and agreed to proceed.    History of Present Illness: Susan Bauer is a 29 y.o. who identifies as a female who was assigned female at birth, and is being seen today for Cough that makes her head hurt in the  B temple areas. Has had a cough for almost 3 weeks, started out as a cold. Thinks she is getting better; not productive and is coughing less often. No longer feels like she has nasal congestion, no sinus pressure, no headache except for when she coughs. Mostly just coughs in the morning some and then maybe occasionally during the day. No fever  Is worried she could be having a stroke or aneurysm.   No vision changes, occasional headache is not severe, no stiff neck or neck pain, no jaw pain, no weakness, no confusion, no numbness.   HPI: HPI  Problems:  Patient Active Problem List   Diagnosis Date Noted   Major depressive disorder, recurrent, severe without psychotic features (HCC) 09/22/2014   Social anxiety disorder 09/22/2014   GAD (generalized anxiety disorder) 09/22/2014   Eating disorder, unspecified 07/13/2013   ADD (attention deficit disorder) without hyperactivity 10/14/2011   Major depressive disorder, recurrent episode, mild 10/08/2011   Generalized anxiety disorder 10/08/2011    Allergies: Allergies[1] Medications: Current Medications[2]  Observations/Objective: Patient is  well-developed, well-nourished in no acute distress.  Resting comfortably  at home.  Head is normocephalic, atraumatic.  No labored breathing.  Speech is clear and coherent with logical content.  Patient is alert and oriented at baseline.  Smile is symmetrical  Assessment and Plan: 1. Post-viral cough syndrome (Primary)  I do not think she is having a stroke, TIA, ruptured  aneurysm, or temporal arteritis. Pain is only when she coughs.  If pain becomes worse or more frequent or constant, she should be evaluated in person. I suspect it will resolve as she continues to recover from her cold.   Follow Up Instructions: I discussed the assessment and treatment plan with the patient. The patient was provided an opportunity to ask questions and all were answered. The patient agreed with the plan and demonstrated an understanding of the instructions.  A copy of instructions were sent to the patient via MyChart unless otherwise noted below.   The patient was advised to call back or seek an in-person evaluation if the symptoms worsen or if the condition fails to improve as anticipated.    Jon CHRISTELLA Belt, NP    [1]  Allergies Allergen Reactions   Ritalin  La [Methylphenidate  Hcl Er (La)] Other (See Comments)    Tachycardia    Sulfa Antibiotics   [2]  Current Outpatient Medications:    ARIPiprazole  (ABILIFY ) 2 MG tablet, Take 1 tablet (2 mg total) by mouth daily., Disp: 30 tablet, Rfl: 2   benzonatate  (TESSALON ) 200 MG capsule, Take 1 capsule (200 mg total) by mouth 2 (two) times daily as needed for cough., Disp: 20 capsule, Rfl: 0   FLUoxetine  (PROZAC ) 20 MG capsule, Take 1 capsule (20 mg total) by mouth daily. Take Prozac  20 mg daily for 1 week, 40 mg daily for one week, and then increase to 60 mg (20 mg capsule and 40 mg capsule) daily, Disp: 30 capsule, Rfl: 2   FLUoxetine  (PROZAC ) 40 MG capsule, Take 1 capsule (40 mg total) by mouth daily. Start after taking Prozac  20 mg daily for 1 week. After taking Prozac  40 daily mg for 1 week increase the dose to Prozac  60 mg daily (40 mg capsule plus a 20 mg capsule), Disp: 30 capsule, Rfl: 2   norethindrone-ethinyl estradiol (JUNEL FE,GILDESS FE,LOESTRIN FE) 1-20 MG-MCG tablet, Take 1 tablet by mouth daily., Disp: , Rfl:    promethazine -dextromethorphan (PROMETHAZINE -DM) 6.25-15 MG/5ML syrup, Take 5 mLs by mouth 4 (four) times  daily as needed., Disp: 240 mL, Rfl: 0   propranolol  (INDERAL ) 10 MG tablet, Take 1 tablet (10 mg total) by mouth 2 (two) times daily as needed., Disp: 60 tablet, Rfl: 2  "

## 2024-11-17 NOTE — Patient Instructions (Signed)
" °  Adelyn M Foisy, thank you for joining Jon CHRISTELLA Belt, NP for today's virtual visit.  While this provider is not your primary care provider (PCP), if your PCP is located in our provider database this encounter information will be shared with them immediately following your visit.   A [facility] MyChart account gives you access to today's visit and all your visits, tests, and labs performed at [facility]  click here if you don't have a [facility] MyChart account or go to [email or web address]  Consent: (Patient) Susan Bauer provided verbal consent for this virtual visit at the beginning of the encounter.  Current Medications:  Current Outpatient Medications:    ARIPiprazole  (ABILIFY ) 2 MG tablet, Take 1 tablet (2 mg total) by mouth daily., Disp: 30 tablet, Rfl: 2   benzonatate  (TESSALON ) 200 MG capsule, Take 1 capsule (200 mg total) by mouth 2 (two) times daily as needed for cough., Disp: 20 capsule, Rfl: 0   FLUoxetine  (PROZAC ) 20 MG capsule, Take 1 capsule (20 mg total) by mouth daily. Take Prozac  20 mg daily for 1 week, 40 mg daily for one week, and then increase to 60 mg (20 mg capsule and 40 mg capsule) daily, Disp: 30 capsule, Rfl: 2   FLUoxetine  (PROZAC ) 40 MG capsule, Take 1 capsule (40 mg total) by mouth daily. Start after taking Prozac  20 mg daily for 1 week. After taking Prozac  40 daily mg for 1 week increase the dose to Prozac  60 mg daily (40 mg capsule plus a 20 mg capsule), Disp: 30 capsule, Rfl: 2   norethindrone-ethinyl estradiol (JUNEL FE,GILDESS FE,LOESTRIN FE) 1-20 MG-MCG tablet, Take 1 tablet by mouth daily., Disp: , Rfl:    promethazine -dextromethorphan (PROMETHAZINE -DM) 6.25-15 MG/5ML syrup, Take 5 mLs by mouth 4 (four) times daily as needed., Disp: 240 mL, Rfl: 0   propranolol  (INDERAL ) 10 MG tablet, Take 1 tablet (10 mg total) by mouth 2 (two) times daily as needed., Disp: 60 tablet, Rfl: 2   Medications ordered in this encounter:  No orders of the  defined types were placed in this encounter.    *If you need refills on other medications prior to your next appointment, please contact your pharmacy*  Follow-Up: Call back or seek an in-person evaluation if the symptoms worsen or if the condition fails to improve as anticipated.  [facility] [phone]  Other Instructions  If your pain becomes more frequent, constant, or severe, please get checked in person. I suspect it will get better and stop happening as you continue to recover from your cold over christmas.    If you have been instructed to have an in-person evaluation today at a local Urgent Care facility, please use the link below. It will take you to a list of all of our available [facility], including address, phone number and hours of operation. Please do not delay care.  [facility]  If you or a family member do not have a primary care provider, use the link below to schedule a visit and establish care. When you choose a [facility] primary care physician or advanced practice provider, you gain a long-term partner in health. Find a Primary Care Provider  Learn more about 's in-office and virtual care options:  - Get Care Now  "

## 2024-11-18 ENCOUNTER — Ambulatory Visit (HOSPITAL_COMMUNITY)

## 2024-11-27 ENCOUNTER — Telehealth

## 2024-11-28 ENCOUNTER — Telehealth

## 2024-11-29 ENCOUNTER — Telehealth (HOSPITAL_COMMUNITY): Payer: Self-pay

## 2024-11-29 NOTE — Telephone Encounter (Signed)
 I called the patient and told her what the doctor said. Patient voiced her understanding

## 2024-11-29 NOTE — Telephone Encounter (Signed)
 Patient is calling about the Prozac , she states when she increased the dose to 40 mg she was having daily headaches. Patient did not take the 40 mg today and has no headache. She wants to know if she should just stay at 20 mg. Please review and advise, thank you

## 2024-12-15 ENCOUNTER — Other Ambulatory Visit (HOSPITAL_COMMUNITY): Payer: Self-pay

## 2024-12-15 ENCOUNTER — Encounter (HOSPITAL_COMMUNITY): Payer: Self-pay

## 2024-12-15 MED ORDER — NORGESTIMATE-ETH ESTRADIOL 0.25-35 MG-MCG PO TABS
1.0000 | ORAL_TABLET | Freq: Every day | ORAL | 4 refills | Status: AC
  Filled 2024-12-15 (×2): qty 84, 84d supply, fill #0

## 2024-12-16 ENCOUNTER — Other Ambulatory Visit (HOSPITAL_BASED_OUTPATIENT_CLINIC_OR_DEPARTMENT_OTHER): Payer: Self-pay

## 2024-12-16 ENCOUNTER — Other Ambulatory Visit (HOSPITAL_COMMUNITY): Payer: Self-pay

## 2024-12-16 ENCOUNTER — Other Ambulatory Visit: Payer: Self-pay

## 2024-12-23 ENCOUNTER — Telehealth (HOSPITAL_COMMUNITY): Admitting: Psychiatry
